# Patient Record
Sex: Male | Born: 1975
Health system: Southern US, Community
[De-identification: ages and names within clinical notes are randomized; demographics above are authoritative.]

## PROBLEM LIST (undated history)

## (undated) DIAGNOSIS — I10 Essential (primary) hypertension: Secondary | ICD-10-CM

## (undated) DIAGNOSIS — I2510 Atherosclerotic heart disease of native coronary artery without angina pectoris: Secondary | ICD-10-CM

## (undated) DIAGNOSIS — IMO0002 Reserved for concepts with insufficient information to code with codable children: Secondary | ICD-10-CM

## (undated) DIAGNOSIS — E1165 Type 2 diabetes mellitus with hyperglycemia: Secondary | ICD-10-CM

## (undated) DIAGNOSIS — E785 Hyperlipidemia, unspecified: Secondary | ICD-10-CM

---

## 2000-11-14 ENCOUNTER — Encounter: Payer: Self-pay | Admitting: Emergency Medicine

## 2000-11-14 ENCOUNTER — Emergency Department (HOSPITAL_COMMUNITY): Admission: EM | Admit: 2000-11-14 | Discharge: 2000-11-14 | Payer: Self-pay | Admitting: Emergency Medicine

## 2004-10-11 ENCOUNTER — Emergency Department (HOSPITAL_COMMUNITY): Admission: EM | Admit: 2004-10-11 | Discharge: 2004-10-11 | Payer: Self-pay | Admitting: Emergency Medicine

## 2006-07-12 ENCOUNTER — Ambulatory Visit: Payer: Self-pay | Admitting: Family Medicine

## 2006-07-13 ENCOUNTER — Ambulatory Visit: Payer: Self-pay | Admitting: *Deleted

## 2007-02-20 ENCOUNTER — Encounter (INDEPENDENT_AMBULATORY_CARE_PROVIDER_SITE_OTHER): Payer: Self-pay | Admitting: *Deleted

## 2007-03-07 ENCOUNTER — Ambulatory Visit: Payer: Self-pay | Admitting: Internal Medicine

## 2007-03-07 LAB — CONVERTED CEMR LAB
ALT: 96 units/L — ABNORMAL HIGH (ref 0–53)
AST: 34 units/L (ref 0–37)
Albumin: 4.4 g/dL (ref 3.5–5.2)
Alkaline Phosphatase: 59 units/L (ref 39–117)
BUN: 14 mg/dL (ref 6–23)
Calcium: 9.3 mg/dL (ref 8.4–10.5)
Creatinine, Ser: 1.03 mg/dL (ref 0.40–1.50)
Sodium: 142 meq/L (ref 135–145)
Total Bilirubin: 0.5 mg/dL (ref 0.3–1.2)

## 2007-03-08 ENCOUNTER — Encounter: Payer: Self-pay | Admitting: Internal Medicine

## 2007-03-08 LAB — CONVERTED CEMR LAB
HCV Ab: NEGATIVE
Hep B Core Total Ab: NEGATIVE

## 2007-03-12 ENCOUNTER — Ambulatory Visit (HOSPITAL_COMMUNITY): Admission: RE | Admit: 2007-03-12 | Discharge: 2007-03-12 | Payer: Self-pay | Admitting: Cardiovascular Disease

## 2007-03-25 ENCOUNTER — Ambulatory Visit: Payer: Self-pay | Admitting: Internal Medicine

## 2007-04-22 ENCOUNTER — Ambulatory Visit: Payer: Self-pay | Admitting: Family Medicine

## 2007-06-26 ENCOUNTER — Encounter (INDEPENDENT_AMBULATORY_CARE_PROVIDER_SITE_OTHER): Payer: Self-pay | Admitting: Family Medicine

## 2007-06-26 ENCOUNTER — Ambulatory Visit: Payer: Self-pay | Admitting: Family Medicine

## 2007-06-26 LAB — CONVERTED CEMR LAB
AST: 32 units/L (ref 0–37)
Albumin: 4.5 g/dL (ref 3.5–5.2)
Alkaline Phosphatase: 55 units/L (ref 39–117)
Basophils Relative: 1 % (ref 0–1)
Eosinophils Absolute: 0.1 10*3/uL (ref 0.0–0.7)
Eosinophils Relative: 1 % (ref 0–5)
LDL Cholesterol: 193 mg/dL — ABNORMAL HIGH (ref 0–99)
Lymphocytes Relative: 42 % (ref 12–46)
MCHC: 32.6 g/dL (ref 30.0–36.0)
Monocytes Absolute: 0.6 10*3/uL (ref 0.1–1.0)
Neutro Abs: 2.4 10*3/uL (ref 1.7–7.7)
RDW: 13.7 % (ref 11.5–15.5)
Sodium: 139 meq/L (ref 135–145)
Total Bilirubin: 0.6 mg/dL (ref 0.3–1.2)
Total Protein: 7.2 g/dL (ref 6.0–8.3)
VLDL: 50 mg/dL — ABNORMAL HIGH (ref 0–40)

## 2007-10-08 ENCOUNTER — Ambulatory Visit: Payer: Self-pay | Admitting: Family Medicine

## 2007-11-14 ENCOUNTER — Ambulatory Visit: Payer: Self-pay | Admitting: Internal Medicine

## 2007-11-14 LAB — CONVERTED CEMR LAB
AST: 24 units/L (ref 0–37)
Albumin: 4.4 g/dL (ref 3.5–5.2)
BUN: 13 mg/dL (ref 6–23)
Calcium: 9.4 mg/dL (ref 8.4–10.5)
Glucose, Bld: 113 mg/dL — ABNORMAL HIGH (ref 70–99)
Potassium: 3.9 meq/L (ref 3.5–5.3)
Total Bilirubin: 0.6 mg/dL (ref 0.3–1.2)
Total Protein: 7.2 g/dL (ref 6.0–8.3)

## 2007-12-03 ENCOUNTER — Ambulatory Visit: Payer: Self-pay | Admitting: Internal Medicine

## 2009-03-10 ENCOUNTER — Ambulatory Visit: Payer: Self-pay | Admitting: Internal Medicine

## 2009-03-15 ENCOUNTER — Ambulatory Visit: Payer: Self-pay | Admitting: Internal Medicine

## 2009-03-15 ENCOUNTER — Encounter: Payer: Self-pay | Admitting: Internal Medicine

## 2009-03-15 LAB — CONVERTED CEMR LAB
AST: 18 units/L (ref 0–37)
Albumin: 4.5 g/dL (ref 3.5–5.2)
CO2: 23 meq/L (ref 19–32)
Calcium: 9.2 mg/dL (ref 8.4–10.5)
Glucose, Bld: 101 mg/dL — ABNORMAL HIGH (ref 70–99)
HDL: 56 mg/dL (ref >39)
LDL Cholesterol: 168 mg/dL — ABNORMAL HIGH (ref 0–99)
Total Protein: 7 g/dL (ref 6.0–8.3)
VLDL: 16 mg/dL (ref 0–40)

## 2009-05-25 ENCOUNTER — Ambulatory Visit: Payer: Self-pay | Admitting: Internal Medicine

## 2009-05-25 ENCOUNTER — Encounter (INDEPENDENT_AMBULATORY_CARE_PROVIDER_SITE_OTHER): Payer: Self-pay | Admitting: Family Medicine

## 2009-05-25 LAB — CONVERTED CEMR LAB
Albumin: 4.4 g/dL (ref 3.5–5.2)
BUN: 16 mg/dL (ref 6–23)
HDL: 56 mg/dL (ref >39)
Hgb A1c MFr Bld: 6.2 % — ABNORMAL HIGH (ref 4.6–6.1)
LDL Cholesterol: 122 mg/dL — ABNORMAL HIGH (ref 0–99)
Sodium: 140 meq/L (ref 135–145)
Total Bilirubin: 0.8 mg/dL (ref 0.3–1.2)
Total CHOL/HDL Ratio: 3.5
Total Protein: 7.4 g/dL (ref 6.0–8.3)
Triglycerides: 86 mg/dL (ref <150)

## 2009-10-20 ENCOUNTER — Encounter (INDEPENDENT_AMBULATORY_CARE_PROVIDER_SITE_OTHER): Payer: Self-pay | Admitting: Adult Health

## 2009-10-20 ENCOUNTER — Ambulatory Visit: Payer: Self-pay | Admitting: Family Medicine

## 2009-10-20 ENCOUNTER — Ambulatory Visit (HOSPITAL_COMMUNITY): Admission: RE | Admit: 2009-10-20 | Discharge: 2009-10-20 | Payer: Self-pay | Admitting: Adult Health

## 2009-10-20 LAB — CONVERTED CEMR LAB
BUN: 16 mg/dL (ref 6–23)
CO2: 23 meq/L (ref 19–32)
Chloride: 108 meq/L (ref 96–112)
HDL: 59 mg/dL (ref >39)
Total Bilirubin: 0.5 mg/dL (ref 0.3–1.2)
Triglycerides: 192 mg/dL — ABNORMAL HIGH (ref <150)

## 2010-06-26 ENCOUNTER — Encounter: Payer: Self-pay | Admitting: Family Medicine

## 2016-08-31 ENCOUNTER — Inpatient Hospital Stay (HOSPITAL_COMMUNITY)
Admission: EM | Admit: 2016-08-31 | Discharge: 2016-09-02 | DRG: 247 | Disposition: A | Discharge status: Home / Self Care | Payer: Medicaid Other | Attending: Internal Medicine | Admitting: Internal Medicine

## 2016-08-31 ENCOUNTER — Encounter (HOSPITAL_COMMUNITY): Payer: Self-pay

## 2016-08-31 ENCOUNTER — Encounter (HOSPITAL_COMMUNITY)
Admission: EM | Disposition: A | Discharge status: Home / Self Care | Payer: Self-pay | Source: Home / Self Care | Attending: Internal Medicine

## 2016-08-31 DIAGNOSIS — I2510 Atherosclerotic heart disease of native coronary artery without angina pectoris: Secondary | ICD-10-CM | POA: Diagnosis present

## 2016-08-31 DIAGNOSIS — E876 Hypokalemia: Secondary | ICD-10-CM | POA: Diagnosis not present

## 2016-08-31 DIAGNOSIS — I2119 ST elevation (STEMI) myocardial infarction involving other coronary artery of inferior wall: Secondary | ICD-10-CM | POA: Diagnosis present

## 2016-08-31 DIAGNOSIS — I10 Essential (primary) hypertension: Secondary | ICD-10-CM | POA: Diagnosis present

## 2016-08-31 DIAGNOSIS — I255 Ischemic cardiomyopathy: Secondary | ICD-10-CM | POA: Diagnosis present

## 2016-08-31 DIAGNOSIS — R079 Chest pain, unspecified: Secondary | ICD-10-CM | POA: Diagnosis present

## 2016-08-31 DIAGNOSIS — E785 Hyperlipidemia, unspecified: Secondary | ICD-10-CM | POA: Diagnosis present

## 2016-08-31 DIAGNOSIS — E1165 Type 2 diabetes mellitus with hyperglycemia: Secondary | ICD-10-CM | POA: Diagnosis present

## 2016-08-31 DIAGNOSIS — I2111 ST elevation (STEMI) myocardial infarction involving right coronary artery: Secondary | ICD-10-CM

## 2016-08-31 DIAGNOSIS — I213 ST elevation (STEMI) myocardial infarction of unspecified site: Secondary | ICD-10-CM

## 2016-08-31 DIAGNOSIS — Z955 Presence of coronary angioplasty implant and graft: Secondary | ICD-10-CM

## 2016-08-31 HISTORY — PX: CORONARY STENT INTERVENTION: CATH118234

## 2016-08-31 HISTORY — PX: LEFT HEART CATH AND CORONARY ANGIOGRAPHY: CATH118249

## 2016-08-31 HISTORY — DX: Hyperlipidemia, unspecified: E78.5

## 2016-08-31 HISTORY — DX: Atherosclerotic heart disease of native coronary artery without angina pectoris: I25.10

## 2016-08-31 HISTORY — DX: Essential (primary) hypertension: I10

## 2016-08-31 HISTORY — DX: Reserved for concepts with insufficient information to code with codable children: IMO0002

## 2016-08-31 HISTORY — DX: Type 2 diabetes mellitus with hyperglycemia: E11.65

## 2016-08-31 LAB — COMPREHENSIVE METABOLIC PANEL
ALT: 74 U/L — ABNORMAL HIGH (ref 17–63)
ANION GAP: 11 (ref 5–15)
AST: 57 U/L — ABNORMAL HIGH (ref 15–41)
Albumin: 4.1 g/dL (ref 3.5–5.0)
Alkaline Phosphatase: 68 U/L (ref 38–126)
BUN: 15 mg/dL (ref 6–20)
CHLORIDE: 97 mmol/L — AB (ref 101–111)
CO2: 25 mmol/L (ref 22–32)
Calcium: 8.8 mg/dL — ABNORMAL LOW (ref 8.9–10.3)
Creatinine, Ser: 1.11 mg/dL (ref 0.61–1.24)
Glucose, Bld: 379 mg/dL — ABNORMAL HIGH (ref 65–99)
Potassium: 3.6 mmol/L (ref 3.5–5.1)
SODIUM: 133 mmol/L — AB (ref 135–145)
Total Bilirubin: 0.4 mg/dL (ref 0.3–1.2)
Total Protein: 6.7 g/dL (ref 6.5–8.1)

## 2016-08-31 LAB — CBC
HEMATOCRIT: 41.8 % (ref 39.0–52.0)
Hemoglobin: 13.8 g/dL (ref 13.0–17.0)
MCH: 26.8 pg (ref 26.0–34.0)
MCHC: 33 g/dL (ref 30.0–36.0)
MCV: 81.3 fL (ref 78.0–100.0)
Platelets: 148 10*3/uL — ABNORMAL LOW (ref 150–400)
RBC: 5.14 MIL/uL (ref 4.22–5.81)
RDW: 13.3 % (ref 11.5–15.5)
WBC: 7.6 10*3/uL (ref 4.0–10.5)

## 2016-08-31 LAB — POCT I-STAT TROPONIN I: Troponin i, poc: 0 ng/mL (ref 0.00–0.08)

## 2016-08-31 LAB — DIFFERENTIAL
BASOS PCT: 0 %
Basophils Absolute: 0 10*3/uL (ref 0.0–0.1)
Eosinophils Absolute: 0.1 10*3/uL (ref 0.0–0.7)
Eosinophils Relative: 1 %
Lymphocytes Relative: 31 %
Lymphs Abs: 2.3 10*3/uL (ref 0.7–4.0)
MONOS PCT: 6 %
Monocytes Absolute: 0.5 10*3/uL (ref 0.1–1.0)
NEUTROS ABS: 4.7 10*3/uL (ref 1.7–7.7)
Neutrophils Relative %: 62 %

## 2016-08-31 LAB — LIPID PANEL
CHOL/HDL RATIO: 3.8 ratio
CHOLESTEROL: 189 mg/dL (ref 0–200)
HDL: 50 mg/dL (ref >40)
LDL Cholesterol: 125 mg/dL — ABNORMAL HIGH (ref 0–99)
TRIGLYCERIDES: 72 mg/dL (ref <150)
VLDL: 14 mg/dL (ref 0–40)

## 2016-08-31 LAB — TROPONIN I: TROPONIN I: 2.74 ng/mL — AB (ref <0.03)

## 2016-08-31 LAB — PROTIME-INR
INR: 0.96
PROTHROMBIN TIME: 12.8 s (ref 11.4–15.2)

## 2016-08-31 LAB — APTT: APTT: 25 s (ref 24–36)

## 2016-08-31 LAB — CBG MONITORING, ED: Glucose-Capillary: 351 mg/dL — ABNORMAL HIGH (ref 65–99)

## 2016-08-31 SURGERY — LEFT HEART CATH AND CORONARY ANGIOGRAPHY
Anesthesia: LOCAL

## 2016-08-31 MED ORDER — HEPARIN (PORCINE) IN NACL 2-0.9 UNIT/ML-% IJ SOLN
INTRAMUSCULAR | Status: DC | PRN
Start: 2016-08-31 — End: 2016-08-31
  Administered 2016-08-31: 1000 mL

## 2016-08-31 MED ORDER — ASPIRIN EC 81 MG PO TBEC
81.0000 mg | DELAYED_RELEASE_TABLET | Freq: Every day | ORAL | Status: DC
  Administered 2016-09-01 – 2016-09-02 (×2): 81 mg via ORAL
  Filled 2016-08-31 (×2): qty 1

## 2016-08-31 MED ORDER — SODIUM CHLORIDE 0.9 % WEIGHT BASED INFUSION: 1.0000 mL/kg/h | INTRAVENOUS | Status: AC

## 2016-08-31 MED ORDER — HEPARIN SODIUM (PORCINE) 5000 UNIT/ML IJ SOLN: 5000.0000 [IU] | Freq: Three times a day (TID) | INTRAMUSCULAR | Status: DC

## 2016-08-31 MED ORDER — TICAGRELOR 90 MG PO TABS
ORAL_TABLET | ORAL | Status: AC
  Filled 2016-08-31: qty 1

## 2016-08-31 MED ORDER — VERAPAMIL HCL 2.5 MG/ML IV SOLN
INTRAVENOUS | Status: AC
  Filled 2016-08-31: qty 2

## 2016-08-31 MED ORDER — NITROGLYCERIN 0.4 MG SL SUBL: 0.4000 mg | SUBLINGUAL_TABLET | SUBLINGUAL | Status: DC | PRN

## 2016-08-31 MED ORDER — INSULIN ASPART 100 UNIT/ML ~~LOC~~ SOLN
4.0000 [IU] | Freq: Three times a day (TID) | SUBCUTANEOUS | Status: DC
  Administered 2016-09-01 – 2016-09-02 (×5): 4 [IU] via SUBCUTANEOUS

## 2016-08-31 MED ORDER — HEPARIN SODIUM (PORCINE) 1000 UNIT/ML IJ SOLN
INTRAMUSCULAR | Status: DC | PRN
  Administered 2016-08-31 (×2): 4000 [IU] via INTRAVENOUS

## 2016-08-31 MED ORDER — ACETAMINOPHEN 325 MG PO TABS: 650.0000 mg | ORAL_TABLET | ORAL | Status: DC | PRN

## 2016-08-31 MED ORDER — INSULIN ASPART 100 UNIT/ML ~~LOC~~ SOLN
0.0000 [IU] | Freq: Three times a day (TID) | SUBCUTANEOUS | Status: DC
  Administered 2016-09-01: 3 [IU] via SUBCUTANEOUS
  Administered 2016-09-01: 5 [IU] via SUBCUTANEOUS
  Administered 2016-09-01 – 2016-09-02 (×2): 3 [IU] via SUBCUTANEOUS
  Administered 2016-09-02: 5 [IU] via SUBCUTANEOUS

## 2016-08-31 MED ORDER — IOPAMIDOL (ISOVUE-370) INJECTION 76%
INTRAVENOUS | Status: AC
  Filled 2016-08-31: qty 50

## 2016-08-31 MED ORDER — SODIUM CHLORIDE 0.9% FLUSH
3.0000 mL | Freq: Two times a day (BID) | INTRAVENOUS | Status: DC
  Administered 2016-09-01 – 2016-09-02 (×2): 3 mL via INTRAVENOUS

## 2016-08-31 MED ORDER — NITROGLYCERIN 1 MG/10 ML FOR IR/CATH LAB
INTRA_ARTERIAL | Status: AC
  Filled 2016-08-31: qty 10

## 2016-08-31 MED ORDER — LIDOCAINE HCL (PF) 1 % IJ SOLN
INTRAMUSCULAR | Status: DC | PRN
Start: 2016-08-31 — End: 2016-08-31
  Administered 2016-08-31: 2 mL

## 2016-08-31 MED ORDER — SODIUM CHLORIDE 0.9 % IV SOLN: 250.0000 mL | INTRAVENOUS | Status: DC | PRN

## 2016-08-31 MED ORDER — LIDOCAINE HCL (PF) 1 % IJ SOLN
INTRAMUSCULAR | Status: AC
  Filled 2016-08-31: qty 30

## 2016-08-31 MED ORDER — HYDRALAZINE HCL 20 MG/ML IJ SOLN: 5.0000 mg | Freq: Four times a day (QID) | INTRAMUSCULAR | Status: DC | PRN

## 2016-08-31 MED ORDER — SODIUM CHLORIDE 0.9% FLUSH: 3.0000 mL | INTRAVENOUS | Status: DC | PRN

## 2016-08-31 MED ORDER — METOPROLOL TARTRATE 25 MG PO TABS
25.0000 mg | ORAL_TABLET | Freq: Two times a day (BID) | ORAL | Status: DC
  Administered 2016-09-01 (×3): 25 mg via ORAL
  Filled 2016-08-31 (×5): qty 1

## 2016-08-31 MED ORDER — ATORVASTATIN CALCIUM 80 MG PO TABS
80.0000 mg | ORAL_TABLET | Freq: Every day | ORAL | Status: DC
  Administered 2016-09-01 – 2016-09-02 (×2): 80 mg via ORAL
  Filled 2016-08-31 (×3): qty 1

## 2016-08-31 MED ORDER — TICAGRELOR 90 MG PO TABS
ORAL_TABLET | ORAL | Status: DC | PRN
  Administered 2016-08-31: 180 mg via ORAL

## 2016-08-31 MED ORDER — ONDANSETRON HCL 4 MG/2ML IJ SOLN: 4.0000 mg | Freq: Four times a day (QID) | INTRAMUSCULAR | Status: DC | PRN

## 2016-08-31 MED ORDER — IOPAMIDOL (ISOVUE-370) INJECTION 76%
INTRAVENOUS | Status: AC
  Filled 2016-08-31: qty 125

## 2016-08-31 MED ORDER — ASPIRIN 81 MG PO CHEW: 81.0000 mg | CHEWABLE_TABLET | Freq: Every day | ORAL | Status: DC

## 2016-08-31 MED ORDER — HEPARIN SODIUM (PORCINE) 5000 UNIT/ML IJ SOLN
5000.0000 [IU] | Freq: Three times a day (TID) | INTRAMUSCULAR | Status: DC
  Administered 2016-09-01 – 2016-09-02 (×4): 5000 [IU] via SUBCUTANEOUS
  Filled 2016-08-31 (×4): qty 1

## 2016-08-31 MED ORDER — HEPARIN (PORCINE) IN NACL 2-0.9 UNIT/ML-% IJ SOLN
INTRAMUSCULAR | Status: AC
  Filled 2016-08-31: qty 1000

## 2016-08-31 MED ORDER — HEPARIN SODIUM (PORCINE) 1000 UNIT/ML IJ SOLN
INTRAMUSCULAR | Status: AC
  Filled 2016-08-31: qty 1

## 2016-08-31 MED ORDER — SODIUM CHLORIDE 0.9 % IV SOLN
INTRAVENOUS | Status: DC | PRN
  Administered 2016-08-31: 75 mL/h via INTRAVENOUS

## 2016-08-31 MED ORDER — TICAGRELOR 90 MG PO TABS
90.0000 mg | ORAL_TABLET | Freq: Two times a day (BID) | ORAL | Status: DC
  Administered 2016-09-01 – 2016-09-02 (×3): 90 mg via ORAL
  Filled 2016-08-31 (×4): qty 1

## 2016-08-31 MED ORDER — INSULIN ASPART 100 UNIT/ML ~~LOC~~ SOLN
0.0000 [IU] | Freq: Every day | SUBCUTANEOUS | Status: DC
  Administered 2016-09-01: 4 [IU] via SUBCUTANEOUS

## 2016-08-31 MED ORDER — NITROGLYCERIN 1 MG/10 ML FOR IR/CATH LAB
INTRA_ARTERIAL | Status: DC | PRN
  Administered 2016-08-31: 200 ug

## 2016-08-31 SURGICAL SUPPLY — 18 items
BALLN EUPHORA RX 2.5X12 (BALLOONS) ×2
BALLN ~~LOC~~ EMERGE MR 3.5X20 (BALLOONS) ×2
BALLOON EUPHORA RX 2.5X12 (BALLOONS) ×1 IMPLANT
BALLOON ~~LOC~~ EMERGE MR 3.5X20 (BALLOONS) ×1 IMPLANT
CATH 5FR JL3.5 JR4 ANG PIG MP (CATHETERS) ×2 IMPLANT
CATH VISTA GUIDE 6FR JR4 (CATHETERS) ×2 IMPLANT
DEVICE RAD COMP TR BAND LRG (VASCULAR PRODUCTS) ×2 IMPLANT
GLIDESHEATH SLEND SS 6F .021 (SHEATH) ×2 IMPLANT
GUIDEWIRE INQWIRE 1.5J.035X260 (WIRE) ×1 IMPLANT
INQWIRE 1.5J .035X260CM (WIRE) ×2
KIT ENCORE 26 ADVANTAGE (KITS) ×2 IMPLANT
KIT HEART LEFT (KITS) ×2 IMPLANT
PACK CARDIAC CATHETERIZATION (CUSTOM PROCEDURE TRAY) ×2 IMPLANT
STENT PROMUS PREM MR 3.5X38 (Permanent Stent) ×2 IMPLANT
SYR MEDRAD MARK V 150ML (SYRINGE) ×2 IMPLANT
TRANSDUCER W/STOPCOCK (MISCELLANEOUS) ×2 IMPLANT
TUBING CIL FLEX 10 FLL-RA (TUBING) ×2 IMPLANT
WIRE ASAHI PROWATER 180CM (WIRE) ×2 IMPLANT

## 2016-08-31 NOTE — Progress Notes (Signed)
CRITICAL VALUE ALERT  Critical value received:  Troponin 2.74  Date of notification:  08/31/16  Time of notification:  2245  Critical value read back: yes   Nurse who received alert:  Devota Pacekaylee Kerilyn Cortner   MD notified: Cardiology Fellow notified

## 2016-08-31 NOTE — ED Provider Notes (Signed)
[facility]-EMERGENCY DEPT Provider Note   CSN: [id number] Arrival date & time: 08/31/16  2001     History   Chief Complaint Chief Complaint  Patient presents with  . Code STEMI    HPI Scott Mcclain is a 41 y.o. male     The history is provided by the patient and the EMS personnel. No language interpreter was used.  Chest Pain   This is a new problem. The current episode started 1 to 2 hours ago. The problem occurs constantly. The problem has been gradually worsening. The pain is associated with exertion. The pain is present in the substernal region. The pain is at a severity of 6/10. The pain is moderate. The quality of the pain is described as exertional, heavy and pressure-like. The pain does not radiate. Duration of episode(s) is 2 hours. Associated symptoms include diaphoresis and shortness of breath. Pertinent negatives include no abdominal pain, no back pain, no cough, no dizziness, no fever, no headaches, no hemoptysis, no irregular heartbeat, no nausea, no near-syncope (lightheadeness), no palpitations, no syncope, no vomiting and no weakness. He has tried nothing for the symptoms. The treatment provided no relief.    No past medical history on file.  There are no active problems to display for this patient.   No past surgical history on file.     Home Medications    Prior to Admission medications   Not on File    Family History No family history on file.  Social History Social History  Substance Use Topics  . Smoking status: Not on file  . Smokeless tobacco: Not on file  . Alcohol use Not on file     Allergies   Patient has no allergy information on record.   Review of Systems Review of Systems  Constitutional: Positive for diaphoresis. Negative for activity change, chills, fatigue and fever.  HENT: Negative for congestion and rhinorrhea.   Eyes: Negative for visual disturbance.  Respiratory: Positive for chest tightness and shortness of breath.  Negative for cough, hemoptysis, wheezing and stridor.   Cardiovascular: Positive for chest pain. Negative for palpitations, leg swelling, syncope and near-syncope (lightheadeness).  Gastrointestinal: Negative for abdominal distention, abdominal pain, blood in stool, constipation, diarrhea, nausea and vomiting.  Genitourinary: Negative for difficulty urinating, dysuria and flank pain.  Musculoskeletal: Negative for back pain and gait problem.  Skin: Negative for rash and wound.  Neurological: Negative for dizziness, weakness, light-headedness and headaches.  Psychiatric/Behavioral: Negative for agitation.  All other systems reviewed and are negative.    Physical Exam Updated Vital Signs BP 118/84 (BP Location: Right Arm)   Pulse (!) 56   Temp 97.4 F (36.3 C) (Oral)   Resp 16   Ht 5\' 5"  (1.651 m)   Wt 170 lb (77.1 kg)   SpO2 100%   BMI 28.29 kg/m   Physical Exam  Constitutional: He is oriented to person, place, and time. He appears well-developed and well-nourished. No distress.  HENT:  Head: Normocephalic and atraumatic.  Right Ear: External ear normal.  Left Ear: External ear normal.  Nose: Nose normal.  Mouth/Throat: Oropharynx is clear and moist. No oropharyngeal exudate.  Eyes: Conjunctivae and EOM are normal. Pupils are equal, round, and reactive to light.  Neck: Normal range of motion. Neck supple.  Cardiovascular: Normal rate, normal heart sounds and intact distal pulses.   No murmur heard. Pulmonary/Chest: Effort normal and breath sounds normal. No stridor. No respiratory distress. He has no wheezes. He has no rales. He  exhibits no tenderness.  Abdominal: Soft. There is no tenderness. There is no rebound and no guarding.  Musculoskeletal: He exhibits no edema or tenderness.  Neurological: He is alert and oriented to person, place, and time. He displays normal reflexes. No cranial nerve deficit. He exhibits normal muscle tone. Coordination normal.  Skin: Skin is warm.  Capillary refill takes less than 2 seconds. No rash noted. He is diaphoretic. No erythema. No pallor.  Nursing note and vitals reviewed.    ED Treatments / Results  Labs (all labs ordered are listed, but only abnormal results are displayed) Labs Reviewed  CBC - Abnormal; Notable for the following:       Result Value   Platelets 148 (*)    All other components within normal limits  COMPREHENSIVE METABOLIC PANEL - Abnormal; Notable for the following:    Sodium 133 (*)    Chloride 97 (*)    Glucose, Bld 379 (*)    Calcium 8.8 (*)    AST 57 (*)    ALT 74 (*)    All other components within normal limits  LIPID PANEL - Abnormal; Notable for the following:    LDL Cholesterol 125 (*)    All other components within normal limits  TROPONIN I - Abnormal; Notable for the following:    Troponin I 2.74 (*)    All other components within normal limits  GLUCOSE, CAPILLARY - Abnormal; Notable for the following:    Glucose-Capillary 305 (*)    All other components within normal limits  CBG MONITORING, ED - Abnormal; Notable for the following:    Glucose-Capillary 351 (*)    All other components within normal limits  DIFFERENTIAL  PROTIME-INR  APTT  TROPONIN I  BASIC METABOLIC PANEL  CBC  HEMOGLOBIN A1C  HIV ANTIBODY (ROUTINE TESTING)  TROPONIN I  TROPONIN I  LIPID PANEL  BASIC METABOLIC PANEL  CBC  I-STAT TROPOININ, ED  POCT I-STAT TROPONIN I    EKG  EKG Interpretation  Date/Time:  Thursday August 31 2016 20:02:58 EDT Ventricular Rate:  55 PR Interval:    QRS Duration: 104 QT Interval:  410 QTC Calculation: 393 R Axis:   66 Text Interpretation:  Sinus rhythm Low voltage, precordial leads Abnrm T, consider ischemia, anterolateral lds ST elevation, consider inferior injury ** ** ACUTE MI / STEMI ** ** Confirmed by Maxten Shuler MD, Maxie Slovacek ([id number]) on 09/01/2016 2:45:37 AM       Radiology No results found.  Procedures Procedures (including critical care time)  CRITICAL  CARE Performed by: Canary Brim Delsie Amador Total critical care time: 35 minutes STEMI taken to Cath lab with 100% occlusion of RCA needing stenting.  Critical care time was exclusive of separately billable procedures and treating other patients. Critical care was necessary to treat or prevent imminent or life-threatening deterioration. Critical care was time spent personally by me on the following activities: development of treatment plan with patient and/or surrogate as well as nursing, discussions with consultants, evaluation of patient's response to treatment, examination of patient, obtaining history from patient or surrogate, ordering and performing treatments and interventions, ordering and review of laboratory studies, ordering and review of radiographic studies, pulse oximetry and re-evaluation of patient's condition.   Medications Ordered in ED Medications  aspirin EC tablet 81 mg (not administered)  nitroGLYCERIN (NITROSTAT) SL tablet 0.4 mg (not administered)  acetaminophen (TYLENOL) tablet 650 mg (not administered)  ondansetron (ZOFRAN) injection 4 mg (not administered)  metoprolol tartrate (LOPRESSOR) tablet 25 mg (25 mg Oral  Given 09/01/16 0030)  atorvastatin (LIPITOR) tablet 80 mg (not administered)  hydrALAZINE (APRESOLINE) injection 5 mg (not administered)  insulin aspart (novoLOG) injection 0-15 Units (not administered)  insulin aspart (novoLOG) injection 4 Units (not administered)  insulin aspart (novoLOG) injection 0-5 Units (4 Units Subcutaneous Given 09/01/16 0031)  heparin injection 5,000 Units (not administered)  0.9% sodium chloride infusion (1 mL/kg/hr  77.1 kg Intravenous Transfusing/Transfer 08/31/16 2300)  sodium chloride flush (NS) 0.9 % injection 3 mL (not administered)  sodium chloride flush (NS) 0.9 % injection 3 mL (not administered)  0.9 %  sodium chloride infusion (not administered)  ticagrelor (BRILINTA) tablet 90 mg (not administered)     Initial  Impression / Assessment and Plan / ED Course  I have reviewed the triage vital signs and the nursing notes.  Pertinent labs & imaging results that were available during my care of the patient were reviewed by me and considered in my medical decision making (see chart for details).     Scott Mcclain is a 41 y.o. male with a past medical history significant for hypertension, hyperlipidemia, and diabetes who presents for chest pain. In route, EKG was transmitted and code STEMI was called for possible inferior STEMI. On arrival, patient has received aspirin. Patient is continued to have severe chest pain in his central chest. Patient had associated diaphoresis, lightheadedness, and some nausea. Patient has never had this pain before. Patient denies trauma. Patient had some shortness of breath with it.  Patient denied any recent ears, chills, cough, congestion. He denies any sick contacts or any other problems today. He said he was at work when he started having severe diaphoresis and then the chest pain began. He denies history of DVT, PE, or any heart disease.  Repeat EKG continued to show ST elevations with some inversions. STEMI was continued and cardiology came to the bedside. They agreed with STEMI and will take the patient to the Cath Lab.  Patient taken to cath lab for further management.    Final Clinical Impressions(s) / ED Diagnoses   Final diagnoses:  ST elevation myocardial infarction (STEMI), unspecified artery (HCC)     Clinical Impression: 1. ST elevation myocardial infarction (STEMI), unspecified artery [facility](HCC)     Disposition: Admit to Cardiology after Cath lab    Heide Scaleshristopher J Ilana Prezioso, MD 09/01/16 [id number]

## 2016-08-31 NOTE — ED Notes (Signed)
Cardiology in room with Dr Rush Landmarkegeler and this RN

## 2016-08-31 NOTE — H&P (Signed)
CARDIOLOGY HISTORY AND PHYSICAL     Date: 08/31/2016 Admitting Physician: Scott M [place], MD  Chief Complaint:  Chest pain ____________________________________________________________________ History of Present Illness:  Scott Mcclain is a 40 y.o. old male with medical history noted below presents via EMS with ECG concerning for inferior STEMI.  Patient states he was at work earlier today when he had sudden onset chest pain the he described as a squeezing sensation.  The patient was rated a 6/10.  He also felt short of breath and diaphoretic.  No nausea/vomiting or syncope.  He has a history of DM and hypertension.  No other cardiac history.  Review of Systems:   Review of Systems:  GEN: no fever, chills, nausea, vomiting, weight change  HEENT: no vision or hearing changes  PULM: no coughing, +SOB  CV: +chest pain, palpitations, PND, orthopnea  GI: no abdominal pain  GU: no dysuria  EXT: no swelling  SKIN: no rashes  NEURO: no numbness or tingling  HEME: no bleeding or bruising  GYN: none  --12 point review systems- otherwise negative.  All other systems reviewed and are negative  Past Medical History:  Diagnosis Date  . Diabetes mellitus without complication (HCC)   . Hyperlipidemia   . Hypertension     History reviewed. No pertinent surgical history.  Social History   Social History  . Marital status: N/A    Spouse name: N/A  . Number of children: N/A  . Years of education: N/A   Occupational History  . Not on file.   Social History Main Topics  . Smoking status: Never Smoker  . Smokeless tobacco: Never Used  . Alcohol use Yes     Comment: occ  . Drug use: No  . Sexual activity: Not on file   Other Topics Concern  . Not on file   Social History Narrative  . No narrative on file    Family History  Problem Relation Age of Onset  . Diabetes Mother     Past Cardiovascular History:  - No documented h/o CAD - No documented h/o MI - No documented  h/o CHF - No documented h/o PVD - No documented h/o AAA - No documented h/o valvular heart disease - No documented h/o CVA - No documented h/o Arrhythmias - No documented h/o A-fib  - No documented h/o congenital heart disease - No documented h/o CABG - No documented h/o PCI - No documented h/o cardiac devices (Pacer/ICD/CRT) - No documented h/o cardiac surgery       Most recent stress test:  None  Most recent echocardiography:  None  Most recent left heart catheterization:  None  CABG:  Date/ Physician: None  Device history:  None  Prior to Admission medications   Not on File    No Known Allergies  Social History:   Social History  Substance Use Topics  . Smoking status: Never Smoker  . Smokeless tobacco: Never Used  . Alcohol use Yes     Comment: occ    Family History  Problem Relation Age of Onset  . Diabetes Mother     Physical Examination: Blood pressure 118/84, pulse (!) 56, temperature 97.4 F (36.3 C), temperature source Oral, resp. rate 16, height 5\' 5"  (1.651 m), weight 77.1 kg (170 lb), SpO2 100 %. General:  AAOX 4.  NAD.  NRD. Diaphoretic, mild distress secondary to chest pain HENT: Normocephalic. Atraumatic.  No acute abnom. EYES: PERRL EOMI  Neck: Supple.  No JVD.  No bruits.  Cardiovascular:  Nl S1. Nl S2. No S3. No S4. Nl PMI. No m/r/c. RRR  Pulmonary/Chest: CTA B. No rales. No wheezing.  Abdomen: Soft, NT, no masses, no organomegaly. Neuro: CN intact, no motor/sensory deficit.  Ext: Warm. No edema.  SKIN- intact  No intake or output data in the 24 hours ending 08/31/16 2025  Troponin (Point of Care Test) No results for input(s): TROPIPOC in the last 72 hours. ____________________________________________________________________ Assessment/Plan  Acute inferior STEMI   Assessment:  Patient presents with ECG findings consistent with inferior STEMI.  Proceed to the cath lab for emergent revascularization.   Plan  -  Proceed to cath lab  -   Further recommendations per interventional cardiology after the cath  DM   Assessment:  Patient with history of DM.  States he's on oral medication.  No history of insulin use.  POC glucose is 350.   Plan  -  Insulin coverage while in the hospital  -  Hypoglycemic precautions  HTN   Assessment:  BP is controlled currently.  Medications still need to be reconciled.  Will continue his home medications and supplement as needed.   Plan  -  Continue home medications once reconciled  -  PRN hydralazine for BP >140/90  Scott SnufferGregory Carys Malina, MD, PhD Cardiology   Thank you for consulting cardiology.    Electronically signed by Nada MaclachlanGregory S Doreene Mcclain 08/31/2016 Scott SnufferGregory Shandricka Monroy, MD, PhD Cardiology

## 2016-08-31 NOTE — ED Triage Notes (Signed)
Per EMS, pt from work, pt was working 2 hours pta and became diaphoretic and began having non radiating left sided chest pressure. Denies dizziness or nausea. VSS. Code stemi activated in the field. Upon arrival pt complains of left sided chest pressure 6/10. Pt has 2 IV's and received 324 asa with EMS. Pt on zoll.

## 2016-09-01 ENCOUNTER — Encounter (HOSPITAL_COMMUNITY): Payer: Self-pay | Admitting: Cardiology

## 2016-09-01 ENCOUNTER — Inpatient Hospital Stay (HOSPITAL_COMMUNITY): Payer: Medicaid Other

## 2016-09-01 DIAGNOSIS — E7800 Pure hypercholesterolemia, unspecified: Secondary | ICD-10-CM

## 2016-09-01 DIAGNOSIS — I10 Essential (primary) hypertension: Secondary | ICD-10-CM

## 2016-09-01 DIAGNOSIS — I213 ST elevation (STEMI) myocardial infarction of unspecified site: Secondary | ICD-10-CM

## 2016-09-01 DIAGNOSIS — E119 Type 2 diabetes mellitus without complications: Secondary | ICD-10-CM

## 2016-09-01 LAB — BASIC METABOLIC PANEL
Anion gap: 9 (ref 5–15)
BUN: 11 mg/dL (ref 6–20)
CALCIUM: 9 mg/dL (ref 8.9–10.3)
CHLORIDE: 104 mmol/L (ref 101–111)
CO2: 24 mmol/L (ref 22–32)
CREATININE: 0.66 mg/dL (ref 0.61–1.24)
GFR calc non Af Amer: >60 mL/min (ref >60)
GLUCOSE: 171 mg/dL — AB (ref 65–99)
Potassium: 3.3 mmol/L — ABNORMAL LOW (ref 3.5–5.1)
Sodium: 137 mmol/L (ref 135–145)

## 2016-09-01 LAB — GLUCOSE, CAPILLARY
GLUCOSE-CAPILLARY: 161 mg/dL — AB (ref 65–99)
GLUCOSE-CAPILLARY: 180 mg/dL — AB (ref 65–99)
Glucose-Capillary: 192 mg/dL — ABNORMAL HIGH (ref 65–99)
Glucose-Capillary: 202 mg/dL — ABNORMAL HIGH (ref 65–99)
Glucose-Capillary: 305 mg/dL — ABNORMAL HIGH (ref 65–99)

## 2016-09-01 LAB — ECHOCARDIOGRAM COMPLETE
Height: 65 in
WEIGHTICAEL: 2720 [oz_av]

## 2016-09-01 LAB — CBC
HCT: 41 % (ref 39.0–52.0)
Hemoglobin: 13.7 g/dL (ref 13.0–17.0)
MCH: 26.9 pg (ref 26.0–34.0)
MCHC: 33.4 g/dL (ref 30.0–36.0)
MCV: 80.6 fL (ref 78.0–100.0)
PLATELETS: 163 10*3/uL (ref 150–400)
RBC: 5.09 MIL/uL (ref 4.22–5.81)
RDW: 13.2 % (ref 11.5–15.5)
WBC: 9.5 10*3/uL (ref 4.0–10.5)

## 2016-09-01 LAB — TROPONIN I
Troponin I: 13.85 ng/mL (ref <0.03)
Troponin I: 12.82 ng/mL (ref <0.03)

## 2016-09-01 LAB — LIPID PANEL
CHOL/HDL RATIO: 3.7 ratio
Cholesterol: 187 mg/dL (ref 0–200)
HDL: 51 mg/dL (ref >40)
LDL CALC: 118 mg/dL — AB (ref 0–99)
Triglycerides: 89 mg/dL (ref <150)
VLDL: 18 mg/dL (ref 0–40)

## 2016-09-01 LAB — HIV ANTIBODY (ROUTINE TESTING W REFLEX): HIV SCREEN 4TH GENERATION: NONREACTIVE

## 2016-09-01 LAB — POCT ACTIVATED CLOTTING TIME: ACTIVATED CLOTTING TIME: 312 s

## 2016-09-01 LAB — MRSA PCR SCREENING: MRSA by PCR: NEGATIVE

## 2016-09-01 MED ORDER — POTASSIUM CHLORIDE CRYS ER 20 MEQ PO TBCR
20.0000 meq | EXTENDED_RELEASE_TABLET | Freq: Once | ORAL | Status: AC
  Administered 2016-09-01: 20 meq via ORAL
  Filled 2016-09-01: qty 1

## 2016-09-01 MED FILL — Verapamil HCl IV Soln 2.5 MG/ML: INTRAVENOUS | Qty: 2 | Status: AC

## 2016-09-01 MED FILL — Heparin Sodium (Porcine) 2 Unit/ML in Sodium Chloride 0.9%: INTRAMUSCULAR | Qty: 500 | Status: CN

## 2016-09-01 NOTE — Progress Notes (Signed)
CARDIAC REHAB PHASE I   PRE:  Rate/Rhythm: 64 SR    BP: sitting 109/68    SaO2: 99 RA  MODE:  Ambulation: 350 ft   POST:  Rate/Rhythm: 80 SR    BP: sitting 119/82     SaO2: 100 RA  Tolerated well. Sts he is sore in general. Ed completed with pt and wife. Voiced understanding with good questions asked. He was told he has preDM 2 months ago and started on meds but did not receive any education. He also sts he does not have PCP. Pt would benefit from CM and help getting PCP and more DM education. I went over diet with them. He understands his Brilinta but would benefit from reiteration. Will refer to [place] [facility]. [id number]   Harriet Masson CES, ACSM 09/01/2016 12:49 PM

## 2016-09-01 NOTE — Care Management Note (Signed)
Case Management Note  Patient Details  Name: Scott Mcclain MRN: [id number] Date of Birth: [birth year]  Subjective/Objective:             Patient provided with 30 day card and booklet for Brilinta. Patient assistance application that needs MD signature on fronmt of shadow chart with request on sticky note for MD to fill out. Patient provided with instructions to call [facility] on Monday to schedule follow up and set up PCP.        Action/Plan:   Expected Discharge Date:                  Expected Discharge Plan:     In-House Referral:     Discharge planning Services  CM Consult, Medication Assistance  Post Acute Care Choice:    Choice offered to:     DME Arranged:    DME Agency:     HH Arranged:    HH Agency:     Status of Service:  In process, will continue to follow  If discussed at Long Length of Stay Meetings, dates discussed:    Additional Comments:  Lawerance Sabal, RN 09/01/2016, 4:08 PM

## 2016-09-01 NOTE — Progress Notes (Signed)
  Echocardiogram 2D Echocardiogram has been performed.  Scott Mcclain 09/01/2016, 10:02 AM

## 2016-09-01 NOTE — Progress Notes (Signed)
Inpatient Diabetes Program Recommendations  AACE/ADA: New Consensus Statement on Inpatient Glycemic Control (2015)  Target Ranges:  Prepandial:   less than 140 mg/dL      Peak postprandial:   less than 180 mg/dL (1-2 hours)      Critically ill patients:  140 - 180 mg/dL   Lab Results  Component Value Date   GLUCAP 192 (H) 09/01/2016    Review of Glycemic Control Results for [name] (MRN [id number]) as of 09/01/2016 10:03  Ref. Range 08/31/2016 20:08 09/01/2016 03:34  Glucose Latest Ref Range: 65 - 99 mg/dL 191 (H) 478 (H)   Diabetes history: DM2 Outpatient Diabetes medications: Metformin 500 mg bid Current orders for Inpatient glycemic control: Novolog 4 units tid + Novolog correction 0-15 units tid + 0-5 units hs  Inpatient Diabetes Program Recommendations:  Noted pending A1c. Please consider while Metformin held, Lantus 15 units ( 0.2 units/kg x 77 kg). Will follow.  Thank you, Scott Mcclain. Scott Kon, RN, MSN, CDE Inpatient Glycemic Control Team Team Pager [phone] (8am-5pm) 09/01/2016 10:10 AM

## 2016-09-01 NOTE — Progress Notes (Signed)
Progress Note  Patient Name: Scott Mcclain Date of Encounter: 09/01/2016  Primary Cardiologist: [place]  Subjective   Slight twinge of chest pain this am. Overall feels much better. No dyspnea.   Inpatient Medications    Scheduled Meds: . aspirin EC  81 mg Oral Daily  . atorvastatin  80 mg Oral q1800  . heparin  5,000 Units Subcutaneous Q8H  . insulin aspart  0-15 Units Subcutaneous TID WC  . insulin aspart  0-5 Units Subcutaneous QHS  . insulin aspart  4 Units Subcutaneous TID WC  . metoprolol tartrate  25 mg Oral BID  . sodium chloride flush  3 mL Intravenous Q12H  . ticagrelor  90 mg Oral BID   Continuous Infusions: . sodium chloride     PRN Meds: sodium chloride, acetaminophen, hydrALAZINE, nitroGLYCERIN, ondansetron (ZOFRAN) IV, sodium chloride flush   Vital Signs    Vitals:   09/01/16 0400 09/01/16 0500 09/01/16 0600 09/01/16 0748  BP: 112/78 112/61 110/70   Pulse: (!) 55 (!) 58 (!) 58   Resp: Temp: 98.8 F (37.1 C)   98.4 F (36.9 C)  TempSrc: Oral   Oral  SpO2: 98% 98% 97%   Weight:      Height:        Intake/Output Summary (Last 24 hours) at 09/01/16 0829 Last data filed at 09/01/16 0600  Gross per 24 hour  Intake              537 ml  Output              800 ml  Net             -263 ml   Filed Weights   08/31/16 2006  Weight: 170 lb (77.1 kg)    Telemetry    NSR. One five beat run of idioventricular beats. - Personally Reviewed  ECG    NSR with T wave inversion in the inferior and anterolateral leads. Small Q wave inferiorly. - Personally Reviewed  Physical Exam   GEN: No acute distress.   Neck: No JVD Cardiac: RRR, no murmurs, rubs, or gallops.  Respiratory: Clear to auscultation bilaterally. GI: Soft, nontender, non-distended  MS: No edema; No deformity. Right radial site without hematoma. Neuro:  Nonfocal  Psych: Normal affect   Labs    Chemistry Recent Labs Lab 08/31/16 2008 09/01/16 0334  NA 133* 137    K 3.6 3.3*  CL 97* 104  CO2 25 24  GLUCOSE 379* 171*  BUN 15 11  CREATININE 1.11 0.66  CALCIUM 8.8* 9.0  PROT 6.7  --   ALBUMIN 4.1  --   AST 57*  --   ALT 74*  --   ALKPHOS 68  --   BILITOT 0.4  --   GFRNONAA >60 >60  GFRAA >60 >60  ANIONGAP 11 9     Hematology Recent Labs Lab 08/31/16 2008 09/01/16 0334  WBC 7.6 9.5  RBC 5.14 5.09  HGB 13.8 13.7  HCT 41.8 41.0  MCV 81.3 80.6  MCH 26.8 26.9  MCHC 33.0 33.4  RDW 13.3 13.2  PLT 148* 163    Cardiac Enzymes Recent Labs Lab 08/31/16 2008 08/31/16 2233 09/01/16 0334  TROPONINI <0.03 2.74* 13.85*    Recent Labs Lab 08/31/16 2014  TROPIPOC 0.00     BNPNo results for input(s): BNP, PROBNP in the last 168 hours.   DDimer No results for input(s): DDIMER in the last 168 hours.  Radiology    No results found.  Cardiac Studies   Procedures   Coronary Stent Intervention  Left Heart Cath and Coronary Angiography  Conclusion     Lat 1st Mrg lesion, 75 %stenosed.  Mid LAD lesion, 40 %stenosed.  There is mild left ventricular systolic dysfunction.  LV end diastolic pressure is normal.  The left ventricular ejection fraction is 45-50% by visual estimate.  A STENT PROMUS PREM MR [id number] drug eluting stent was successfully placed.  Prox RCA lesion, 100 %stenosed.  Post intervention, there is a 0% residual stenosis.   1. 2 vessel obstructive CAD    - 100% proximal RCA    - 75% branch of OM1 2. Mild LV dysfunction 3. Normal LVEDP 4. Successful stenting of the proximal to mid RCA with DES.  Plan: DAPT for one year. Statin therapy. Beta blocker. He may be a candidate for fast track discharge if no complications. If he has recurrent angina consider stenting of OM1 branch.      Patient Profile     41 y.o. male with history of DM type 2, HLD, HTN. Presented with acute inferior STEMI   Assessment & Plan    1. Inferior STEMI. Occluded proximal RCA. Peak troponin 13.85. s/p emergent stenting  with DES. Residual 75% stenosis in OM branch. Mild LV dysfunction. Echo pending. Continue DAPT with ASA and Brilinta for one year. Statin and beta blocker therapy. Will transfer to telemetry today. Ambulate. Cardiac Rehab. May be a candidate for fast track DC tomorrow if stable.   2. HTN controlled. On metoprolol  3. HLD - LDL 125. Started on high dose statin  4. DM type 2. A1c pending. On SSI. Will need to resume oral therapy on DC.   5. Hypokalemia - will replete.   Signed, Berkeley Vanaken [place], MD  09/01/2016, 8:29 AM

## 2016-09-01 NOTE — Progress Notes (Signed)
09/01/2016 1315 Received pt to room 2W23 from 2H.  Pt is A&O, no c/o voiced.  Tele monitor applied and CCMD notified.  Oriented to room, call light and bed.  Call bell in reach, family at bedside. Kathryne Hitch

## 2016-09-02 ENCOUNTER — Encounter (HOSPITAL_COMMUNITY): Payer: Self-pay | Admitting: Physician Assistant

## 2016-09-02 ENCOUNTER — Telehealth: Payer: Self-pay | Admitting: Physician Assistant

## 2016-09-02 DIAGNOSIS — I2510 Atherosclerotic heart disease of native coronary artery without angina pectoris: Secondary | ICD-10-CM

## 2016-09-02 DIAGNOSIS — I10 Essential (primary) hypertension: Secondary | ICD-10-CM

## 2016-09-02 DIAGNOSIS — I255 Ischemic cardiomyopathy: Secondary | ICD-10-CM

## 2016-09-02 LAB — HEPATIC FUNCTION PANEL
ALT: 86 U/L — AB (ref 17–63)
AST: 48 U/L — AB (ref 15–41)
Albumin: 3.8 g/dL (ref 3.5–5.0)
Alkaline Phosphatase: 61 U/L (ref 38–126)
BILIRUBIN TOTAL: 0.7 mg/dL (ref 0.3–1.2)
Total Protein: 6.6 g/dL (ref 6.5–8.1)

## 2016-09-02 LAB — BASIC METABOLIC PANEL
Anion gap: 11 (ref 5–15)
BUN: 8 mg/dL (ref 6–20)
CHLORIDE: 104 mmol/L (ref 101–111)
CO2: 24 mmol/L (ref 22–32)
CREATININE: 0.73 mg/dL (ref 0.61–1.24)
Calcium: 9.1 mg/dL (ref 8.9–10.3)
GFR calc non Af Amer: >60 mL/min (ref >60)
Glucose, Bld: 221 mg/dL — ABNORMAL HIGH (ref 65–99)
Potassium: 3.5 mmol/L (ref 3.5–5.1)
SODIUM: 139 mmol/L (ref 135–145)

## 2016-09-02 LAB — CBC
HCT: 42.9 % (ref 39.0–52.0)
Hemoglobin: 14.1 g/dL (ref 13.0–17.0)
MCH: 27 pg (ref 26.0–34.0)
MCHC: 32.9 g/dL (ref 30.0–36.0)
MCV: 82.2 fL (ref 78.0–100.0)
PLATELETS: 157 10*3/uL (ref 150–400)
RBC: 5.22 MIL/uL (ref 4.22–5.81)
RDW: 13.5 % (ref 11.5–15.5)
WBC: 7.8 10*3/uL (ref 4.0–10.5)

## 2016-09-02 LAB — HEMOGLOBIN A1C
Hgb A1c MFr Bld: 11 % — ABNORMAL HIGH (ref 4.8–5.6)
Mean Plasma Glucose: 269 mg/dL

## 2016-09-02 LAB — GLUCOSE, CAPILLARY
GLUCOSE-CAPILLARY: 223 mg/dL — AB (ref 65–99)
Glucose-Capillary: 177 mg/dL — ABNORMAL HIGH (ref 65–99)

## 2016-09-02 MED ORDER — ASPIRIN 81 MG PO TBEC: 81.0000 mg | DELAYED_RELEASE_TABLET | Freq: Every day | ORAL | 3 refills | Status: DC

## 2016-09-02 MED ORDER — TICAGRELOR 90 MG PO TABS: 90.0000 mg | ORAL_TABLET | Freq: Two times a day (BID) | ORAL | 3 refills | Status: DC

## 2016-09-02 MED ORDER — TICAGRELOR 90 MG PO TABS: 90.0000 mg | ORAL_TABLET | Freq: Two times a day (BID) | ORAL | 0 refills | Status: DC

## 2016-09-02 MED ORDER — ATORVASTATIN CALCIUM 80 MG PO TABS: 80.0000 mg | ORAL_TABLET | Freq: Every evening | ORAL | 6 refills | Status: DC

## 2016-09-02 MED ORDER — NITROGLYCERIN 0.4 MG SL SUBL: 0.4000 mg | SUBLINGUAL_TABLET | SUBLINGUAL | 3 refills | Status: DC | PRN

## 2016-09-02 MED ORDER — METOPROLOL TARTRATE 25 MG PO TABS: 25.0000 mg | ORAL_TABLET | Freq: Two times a day (BID) | ORAL | 6 refills | Status: DC

## 2016-09-02 NOTE — Progress Notes (Signed)
09/02/2016 5:34 PM Discharge AVS meds taken today and those due this evening reviewed.  Follow-up appointments and when to call md reviewed.  D/C IV and TELE.  Questions and concerns addressed.   D/C home per orders. Kathryne Hitch

## 2016-09-02 NOTE — Telephone Encounter (Signed)
Triage, I sent in a Brilinta rx to this patient's initial pharmacy listed [facility] over the weekend then learned they are not open again until Monday. The patient requested instead I prescribe them to [facility].  Tried calling there but got a voice message. I left a voicemail. Can you call [facility] on Monday to make sure they got my message to cancel Brilinta rx? Thanks. Dayna Dunn PA-C

## 2016-09-02 NOTE — Progress Notes (Signed)
Progress Note  Patient Name: Scott Mcclain Date of Encounter: 09/02/2016  Primary Cardiologist: Martinique   Subjective   Feels great. No CP, no SOB. Family at bedside. Ambulated well.  Inpatient Medications    Scheduled Meds: . aspirin EC  81 mg Oral Daily  . atorvastatin  80 mg Oral q1800  . heparin  5,000 Units Subcutaneous Q8H  . insulin aspart  0-15 Units Subcutaneous TID WC  . insulin aspart  0-5 Units Subcutaneous QHS  . insulin aspart  4 Units Subcutaneous TID WC  . metoprolol tartrate  25 mg Oral BID  . sodium chloride flush  3 mL Intravenous Q12H  . ticagrelor  90 mg Oral BID   Continuous Infusions:  PRN Meds: sodium chloride, acetaminophen, hydrALAZINE, nitroGLYCERIN, ondansetron (ZOFRAN) IV, sodium chloride flush   Vital Signs    Vitals:   09/01/16 1321 09/01/16 2005 09/02/16 0459 09/02/16 1001  BP: 106/64 105/65 100/67 (!) 90/53  Pulse:  80 83 76  Resp: '18 16 16   '$ Temp: 98.2 F (36.8 C) 98.2 F (36.8 C) 98.1 F (36.7 C)   TempSrc: Oral Oral Oral   SpO2: 100% 99% 99%   Weight:   159 lb 9.6 oz (72.4 kg)   Height:        Intake/Output Summary (Last 24 hours) at 09/02/16 1253 Last data filed at 09/02/16 1002  Gross per 24 hour  Intake              603 ml  Output             1000 ml  Net             -397 ml   Filed Weights   08/31/16 2006 09/02/16 0459  Weight: 170 lb (77.1 kg) 159 lb 9.6 oz (72.4 kg)    Telemetry    No adverse rhythms - Personally Reviewed  ECG    09/01/16 - NSR with mild TWI diffuse - Personally Reviewed  Physical Exam   GEN: No acute distress.   Neck: No JVD Cardiac: RRR, no murmurs, rubs, or gallops.  Respiratory: Clear to auscultation bilaterally. GI: Soft, nontender, non-distended  MS: No edema; No deformity. Radial site normal, good pulse Neuro:  Nonfocal  Psych: Normal affect   Labs    Chemistry Recent Labs Lab 08/31/16 2008 09/01/16 0334 09/02/16 0238  NA 133* 137 139  K 3.6 3.3* 3.5  CL 97* 104 104   CO2 '25 24 24  '$ GLUCOSE 379* 171* 221*  BUN '15 11 8  '$ CREATININE 1.11 0.66 0.73  CALCIUM 8.8* 9.0 9.1  PROT 6.7  --   --   ALBUMIN 4.1  --   --   AST 57*  --   --   ALT 74*  --   --   ALKPHOS 68  --   --   BILITOT 0.4  --   --   GFRNONAA >60 >60 >60  GFRAA >60 >60 >60  ANIONGAP '11 9 11     '$ Hematology Recent Labs Lab 08/31/16 2008 09/01/16 0334 09/02/16 0238  WBC 7.6 9.5 7.8  RBC 5.14 5.09 5.22  HGB 13.8 13.7 14.1  HCT 41.8 41.0 42.9  MCV 81.3 80.6 82.2  MCH 26.8 26.9 27.0  MCHC 33.0 33.4 32.9  RDW 13.3 13.2 13.5  PLT 148* 163 157    Cardiac Enzymes Recent Labs Lab 08/31/16 2008 08/31/16 2233 09/01/16 0334 09/01/16 0926  TROPONINI <0.03 2.74* 13.85* 12.82*    Recent  Labs Lab 08/31/16 2014  TROPIPOC 0.00     BNPNo results for input(s): BNP, PROBNP in the last 168 hours.   DDimer No results for input(s): DDIMER in the last 168 hours.   Radiology    No results found.  Cardiac Studies    Lat 1st Mrg lesion, 75 %stenosed.  Mid LAD lesion, 40 %stenosed.  There is mild left ventricular systolic dysfunction.  LV end diastolic pressure is normal.  The left ventricular ejection fraction is 45-50% by visual estimate.  A STENT PROMUS PREM MR 3.5X38 drug eluting stent was successfully placed.  Prox RCA lesion, 100 %stenosed.  Post intervention, there is a 0% residual stenosis.   1. 2 vessel obstructive CAD    - 100% proximal RCA    - 75% branch of OM1 2. Mild LV dysfunction 3. Normal LVEDP 4. Successful stenting of the proximal to mid RCA with DES.  Plan: DAPT for one year. Statin therapy. Beta blocker. He may be a candidate for fast track discharge if no complications. If he has recurrent angina consider stenting of OM1 branch.   Indications   ST elevation myocardial infarction involving right coronary artery (HCC) [I21.11 (ICD-10-CM)]  Procedural Details/Technique   Technical Details Indication: 41 yo male presents with an inferior  STEMI  Procedural Details: The right wrist was prepped, draped, and anesthetized with 1% lidocaine. Using the modified Seldinger technique, a 6 French slender sheath was introduced into the right radial artery. 3 mg of verapamil was administered through the sheath, weight-based unfractionated heparin was administered intravenously. Standard Judkins catheters were used for selective coronary angiography and left ventriculography. Catheter exchanges were performed over an exchange length guidewire. Following diagnostic procedure we proceeded with PCI as noted below. There were no immediate procedural complications. A TR band was used for radial hemostasis at the completion of the procedure. The patient was transferred to the post catheterization recovery area for further monitoring. Contrast: 135 cc   Estimated blood loss <50 mL.  During this procedure no sedation was administered.    Complications   Complications documented before study signed (08/31/2016 9:11 PM EDT)    No complications were associated with this study.  Documented by Peter M Martinique, MD - 08/31/2016 9:09 PM EDT    Coronary Findings   Dominance: Right  Left Main  Vessel was injected. Vessel is normal in caliber. Vessel is angiographically normal.  Left Anterior Descending  Mid LAD lesion, 40% stenosed.  Left Circumflex  Lateral First Obtuse Marginal Branch  Lat 1st Mrg lesion, 75% stenosed.  Third Obtuse Marginal Branch  Vessel is small in size.  Right Coronary Artery  Prox RCA lesion, 100% stenosed.  Angioplasty: Lesion crossed with guidewire using a WIRE ASAHI PROWATER 180CM. Pre-stent angioplasty was performed using a BALLOON EUPHORA RX2.5X12. Maximum pressure: 8 atm. A STENT PROMUS PREM MR 3.5X38 drug eluting stent was successfully placed. Stent strut is well apposed. Post-stent angioplasty was performed using a BALLOON [place] EMERGE MR 3.5X20. Maximum pressure: 18 atm. There is no pre-interventional antegrade distal flow  (TIMI 0). The post-interventional distal flow is normal (TIMI 3). The intervention was successful . No complications occurred at this lesion.  There is no residual stenosis post intervention.  Wall Motion              Left Heart   Left Ventricle The left ventricular size is normal. There is mild left ventricular systolic dysfunction. LV end diastolic pressure is normal. The left ventricular ejection fraction is 45-50%  by visual estimate. There are LV function abnormalities due to segmental dysfunction.    Coronary Diagrams   Diagnostic Diagram       Post-Intervention Diagram       Implants     Permanent Stent  Stent Promus Prem Mr 3.5x38      ECHO 09/01/16 - Left ventricle: The cavity size was normal. Systolic function was   normal. The estimated ejection fraction was 55%. Wall motion was   normal; there were no regional wall motion abnormalities. Left   ventricular diastolic function parameters were normal. - Aortic valve: Trileaflet; normal thickness leaflets. There was no   regurgitation. - Aortic root: The aortic root was normal in size. - Mitral valve: Transvalvular velocity was within the normal range.   There was no evidence for stenosis. There was no regurgitation. - Right ventricle: Systolic function was normal. - Right atrium: The atrium was normal in size. - Tricuspid valve: There was trivial regurgitation. - Pulmonic valve: There was no regurgitation. - Pulmonary arteries: Systolic pressure was within the normal   range. - Inferior vena cava: The vessel was normal in size. - Pericardium, extracardiac: There was no pericardial effusion.  Impressions:  - Normal study. Normal global longitudinal strain - 22.7%.  Patient Profile     41 y.o. male with inferior STEMI, RCA stent, DM with HTN uncontrolled  Assessment & Plan    Inferior STEMI  - prox RCA DES.   - OM 75% branch.   - ECHO EF normal, cath mildly reduced.   - DAPT  - Statin (LDL 118)   - metop  DM with HTN  - metop  - oral agent, start metformin 500 po QD.   - A1c 11  - needs PCP follow up  TOC visit with [place] in 1 week.   Signed, Candee Furbish, MD  09/02/2016, 12:53 PM

## 2016-09-02 NOTE — Discharge Summary (Signed)
Discharge Summary    Patient ID: Scott Mcclain,  MRN: [id number], DOB/AGE: 10-Jun-1975 41 y.o.  Admit date: 08/31/2016 Discharge date: 09/02/2016  Primary Care Provider: No primary care provider on file. Primary Cardiologist: Dr. Martinique  Discharge Diagnoses    Active Problems:   STEMI involving right coronary artery [facility])   CAD in native artery   Essential hypertension   Uncontrolled diabetes mellitus ([place])   Ischemic cardiomyopathy    Diagnostic Studies/Procedures    Coronary Stent Intervention  Left Heart Cath and Coronary Angiography  Conclusion    Lat 1st Mrg lesion, 75 %stenosed.  Mid LAD lesion, 40 %stenosed.  There is mild left ventricular systolic dysfunction.  LV end diastolic pressure is normal.  The left ventricular ejection fraction is 45-50% by visual estimate.  A STENT PROMUS PREM MR 3.5X38 drug eluting stent was successfully placed.  Prox RCA lesion, 100 %stenosed.  Post intervention, there is a 0% residual stenosis.   1. 2 vessel obstructive CAD    - 100% proximal RCA    - 75% branch of OM1 2. Mild LV dysfunction 3. Normal LVEDP 4. Successful stenting of the proximal to mid RCA with DES.  Plan: DAPT for one year. Statin therapy. Beta blocker. He may be a candidate for fast track discharge if no complications. If he has recurrent angina consider stenting of OM1 branch.   _____________     History of Present Illness     Scott Mcclain is a 41 y.o. male with history of DM, HTN who presented to [facility] with inferior STEMI. He was at work earlier day of admission when he had sudden onset chest pain the he described as a squeezing sensation. He also felt short of breath and diaphoretic. No nausea/vomiting or syncope. ECG findings consistent with inferior STEMI. He was taken emergently to the cath lab.   Hospital Course    [name] 08/31/16 showed 100% prox RCA, 75% OM1, mild LV dysfunction EF 45-50%, normal LVEDP -> s/p successful stenting of prox-mid  RCA with DES.  He was placed on DAPT. He was treated with standard post MI care with BB, statin. Per cath note, if he has recurrent angina consider stenting of OM1 branch. 2D Echo 09/01/16: EF 55%, no RWMA, essentially normal. Peak troponin 13.85.  Other issues noted this admission: - Brilinta assistance form filled out, rec'd 30 day free card with separate rx and refills sent in as usual - A1c 11 -> Patient provided with instructions to call [facility] on Monday to schedule follow up and set up PCP. Continued on metformin and given rx for glucometer, lancets and supplies in case he did not have this. Asked to check CBGs ac+HS. -LDL was 118. He did have mildly elevated AST and ALT on admission of 57/74, may have been due to MI. F/u LFTs pending at time of DC - asked lab to add them on. He was started on statin this admission. Would recommend recheck as OP if these are abnormal. If the patient is tolerating statin at time of follow-up appointment, would consider rechecking liver function/lipid panel in 6-8 weeks.  The patient feels well today. Dr. Marlou Porch has seen and examined the patient today and feels she is stable for discharge. I have sent a message to our [facility] office's scheduler requesting a follow-up appointment, and our office will call the patient with this information.   _____________  Discharge Vitals Blood pressure 107/62, pulse 73, temperature 98.2 F (36.8 C), temperature source Oral, resp. rate  18, height '5\' 5"'$  (1.651 m), weight 159 lb 9.6 oz (72.4 kg), SpO2 99 %.  Filed Weights   08/31/16 2006 09/02/16 0459  Weight: 170 lb (77.1 kg) 159 lb 9.6 oz (72.4 kg)    Labs & Radiologic Studies    CBC  Recent Labs  08/31/16 2008 09/01/16 0334 09/02/16 0238  WBC 7.6 9.5 7.8  NEUTROABS 4.7  --   --   HGB 13.8 13.7 14.1  HCT 41.8 41.0 42.9  MCV 81.3 80.6 82.2  PLT 148* 163 191   Basic Metabolic Panel  Recent Labs  09/01/16 0334 09/02/16 0238  NA 137 139  K 3.3* 3.5  CL 104  104  CO2 24 24  GLUCOSE 171* 221*  BUN 11 8  CREATININE 0.66 0.73  CALCIUM 9.0 9.1   Liver Function Tests  Recent Labs  08/31/16 2008  AST 57*  ALT 74*  ALKPHOS 68  BILITOT 0.4  PROT 6.7  ALBUMIN 4.1   No results for input(s): LIPASE, AMYLASE in the last 72 hours. Cardiac Enzymes  Recent Labs  08/31/16 2233 09/01/16 0334 09/01/16 0926  TROPONINI 2.74* 13.85* 12.82*   Hemoglobin A1C  Recent Labs  09/01/16 0334  HGBA1C 11.0*   Fasting Lipid Panel  Recent Labs  09/01/16 0334  CHOL 187  HDL 51  LDLCALC 118*  TRIG 89  CHOLHDL 3.7   _____________  No results found. Disposition   Pt is being discharged home today in good condition.  Follow-up Plans & Appointments    Follow-up Information    [place]. Call.   Why:  Please call at 9:00 on Monday to schedule a follow up appointment and establish a primary care doctor. Contact information: [place] [phone]       Medication Assistance Follow up.   Why:  Complete application for Brilinta immediately upon discharge and submit as instructed on first page. You have a 30 day supply for free but will need medication for longer. You must tell your MD before you run out of Brilinta if you can't get more.        Peter Martinique, MD Follow up.   Specialty:  Cardiology Why:  Our office will call you for a follow-up appointment. Please call the office if you have not heard from [place] within 3 days.  Contact information: [address] [place] [address] [phone]          Discharge Instructions    Amb Referral to Cardiac Rehabilitation    Complete by:  As directed    Diagnosis:   Coronary Stents PTCA STEMI     Diet - low sodium heart healthy    Complete by:  As directed    Increase activity slowly    Complete by:  As directed    No driving for 2 weeks. No lifting over 10 lbs for 4 weeks. No sexual  activity for 4 weeks. You may not return to work until cleared by your cardiologist - see work note. Keep procedure site clean & dry. If you notice increased pain, swelling, bleeding or pus, call/return!  You may shower, but no soaking baths/hot tubs/pools for 1 week.   Check your blood sugars three times a day with meals and at bedtime. Keep a log and bring it to your doctor's appointments.      Discharge Medications   Allergies as of 09/02/2016   No Known Allergies  Medication List    TAKE these medications   aspirin 81 MG EC tablet Take 1 tablet (81 mg total) by mouth daily. Start taking on:  09/03/2016   atorvastatin 80 MG tablet Commonly known as:  LIPITOR Take 1 tablet (80 mg total) by mouth every evening. What changed:  medication strength  how much to take  when to take this   metFORMIN 500 MG tablet Commonly known as:  GLUCOPHAGE Take 500 mg by mouth 2 (two) times daily with a meal.   metoprolol tartrate 25 MG tablet Commonly known as:  LOPRESSOR Take 1 tablet (25 mg total) by mouth 2 (two) times daily.   nitroGLYCERIN 0.4 MG SL tablet Commonly known as:  NITROSTAT Place 1 tablet (0.4 mg total) under the tongue every 5 (five) minutes as needed for chest pain (up to 3 doses).   ticagrelor 90 MG Tabs tablet Commonly known as:  BRILINTA Take 1 tablet (90 mg total) by mouth 2 (two) times daily.        Allergies:  No Known Allergies  Aspirin prescribed at discharge?  Yes High Intensity Statin Prescribed? (Lipitor 40-'80mg'$  or Crestor 20-'40mg'$ ): Yes Beta Blocker Prescribed? Yes For EF <40%, was ACEI/ARB Prescribed? No: na ADP Receptor Inhibitor Prescribed? (i.e. Plavix etc.-Includes Medically Managed Patients): Yes For EF <40%, Aldosterone Inhibitor Prescribed? No: na Was EF assessed during THIS hospitalization? Yes Was Cardiac Rehab II ordered? (Included Medically managed Patients): Yes   Outstanding Labs/Studies   LFTs pending at time of  DC.  Duration of Discharge Encounter   Greater than 30 minutes including physician time.  Signed, Nedra Hai Dunn PA-C 09/02/2016, 3:26 PM  Personally seen and examined. Agree with above. Primary Cardiologist: Martin[place]   Subjective   Feels great. No CP, no SOB. Family at bedside. Ambulated well.  Inpatient Medications    Scheduled Meds: . aspirin EC  81 mg Oral Daily  . atorvastatin  80 mg Oral q1800  . heparin  5,000 Units Subcutaneous Q8H  . insulin aspart  0-15 Units Subcutaneous TID WC  . insulin aspart  0-5 Units Subcutaneous QHS  . insulin aspart  4 Units Subcutaneous TID WC  . metoprolol tartrate  25 mg Oral BID  . sodium chloride flush  3 mL Intravenous Q12H  . ticagrelor  90 mg Oral BID   Continuous Infusions: PRN Meds: sodium chloride, acetaminophen, hydrALAZINE, nitroGLYCERIN, ondansetron (ZOFRAN) IV, sodium chloride flush   Vital Signs          Vitals:   09/01/16 1321 09/01/16 2005 09/02/16 0459 09/02/16 1001  BP: 106/64 105/65 100/67 (!) 90/53  Pulse:  80 83 76  Resp: '18 16 16   '$ Temp: 98.2 F (36.8 C) 98.2 F (36.8 C) 98.1 F (36.7 C)   TempSrc: Oral Oral Oral   SpO2: 100% 99% 99%   Weight:   159 lb 9.6 oz (72.4 kg)   Height:        Intake/Output Summary (Last 24 hours) at 09/02/16 1253 Last data filed at 09/02/16 1002  Gross per 24 hour  Intake              603 ml  Output             1000 ml  Net             -397 ml       Filed Weights   08/31/16 2006 09/02/16 0459  Weight: 170 lb (77.1 kg) 159 lb 9.6 oz (72.4 kg)  Telemetry    No adverse rhythms - Personally Reviewed  ECG    09/01/16 - NSR with mild TWI diffuse - Personally Reviewed  Physical Exam   GEN:No acute distress.   Neck:No JVD Cardiac:RRR, no murmurs, rubs, or gallops.  Respiratory:Clear to auscultation bilaterally. VQ:QVZD, nontender, non-distended  MS:No edema; No deformity. Radial site normal, good pulse Neuro:Nonfocal  Psych:  Normal affect   Labs    Chemistry Last Labs    Recent Labs Lab 08/31/16 2008 09/01/16 0334 09/02/16 0238  NA 133* 137 139  K 3.6 3.3* 3.5  CL 97* 104 104  CO2 '25 24 24  '$ GLUCOSE 379* 171* 221*  BUN '15 11 8  '$ CREATININE 1.11 0.66 0.73  CALCIUM 8.8* 9.0 9.1  PROT 6.7  --   --   ALBUMIN 4.1  --   --   AST 57*  --   --   ALT 74*  --   --   ALKPHOS 68  --   --   BILITOT 0.4  --   --   GFRNONAA >60 >60 >60  GFRAA >60 >60 >60  ANIONGAP '11 9 11       '$ Hematology Last Labs    Recent Labs Lab 08/31/16 2008 09/01/16 0334 09/02/16 0238  WBC 7.6 9.5 7.8  RBC 5.14 5.09 5.22  HGB 13.8 13.7 14.1  HCT 41.8 41.0 42.9  MCV 81.3 80.6 82.2  MCH 26.8 26.9 27.0  MCHC 33.0 33.4 32.9  RDW 13.3 13.2 13.5  PLT 148* 163 157      Cardiac Enzymes Last Labs    Recent Labs Lab 08/31/16 2008 08/31/16 2233 09/01/16 0334 09/01/16 0926  TROPONINI <0.03 2.74* 13.85* 12.82*      Recent Labs Lab 08/31/16 2014  TROPIPOC 0.00     BNP Last Labs   No results for input(s): BNP, PROBNP in the last 168 hours.     DDimer  Last Labs   No results for input(s): DDIMER in the last 168 hours.     Radiology    Imaging Results (Last 48 hours)  No results found.    Cardiac Studies    Lat 1st Mrg lesion, 75 %stenosed.  Mid LAD lesion, 40 %stenosed.  There is mild left ventricular systolic dysfunction.  LV end diastolic pressure is normal.  The left ventricular ejection fraction is 45-50% by visual estimate.  A STENT PROMUS PREM MR 3.5X38 drug eluting stent was successfully placed.  Prox RCA lesion, 100 %stenosed.  Post intervention, there is a 0% residual stenosis.  1. 2 vessel obstructive CAD - 100% proximal RCA - 75% branch of OM1 2. Mild LV dysfunction 3. Normal LVEDP 4. Successful stenting of the proximal to mid RCA with DES.  Plan: DAPT for one year. Statin therapy. Beta blocker. He may be a candidate for fast track discharge if no  complications. If he has recurrent angina consider stenting of OM1 branch.   Indications   ST elevation myocardial infarction involving right coronary artery (HCC) [I21.11 (ICD-10-CM)]  Procedural Details/Technique   Technical Details Indication: 41 yo male presents with an inferior STEMI  Procedural Details: The right wrist was prepped, draped, and anesthetized with 1% lidocaine. Using the modified Seldinger technique, a 6 French slender sheath was introduced into the right radial artery. 3 mg of verapamil was administered through the sheath, weight-based unfractionated heparin was administered intravenously. Standard Judkins catheters were used for selective coronary angiography and left ventriculography. Catheter exchanges were performed over an exchange length  guidewire. Following diagnostic procedure we proceeded with PCI as noted below. There were no immediate procedural complications. A TR band was used for radial hemostasis at the completion of the procedure. The patient was transferred to the post catheterization recovery area for further monitoring. Contrast: 135 cc   Estimated blood loss <50 mL.  During this procedure no sedation was administered.    Complications   Complications documented before study signed (08/31/2016 9:11 PM EDT)    No complications were associated with this study.  Documented by Peter M Martinique, MD - 08/31/2016 9:09 PM EDT    Coronary Findings   Dominance: Right  Left Main  Vessel was injected. Vessel is normal in caliber. Vessel is angiographically normal.  Left Anterior Descending  Mid LAD lesion, 40% stenosed.  Left Circumflex  Lateral First Obtuse Marginal Branch  Lat 1st Mrg lesion, 75% stenosed.  Third Obtuse Marginal Branch  Vessel is small in size.  Right Coronary Artery  Prox RCA lesion, 100% stenosed.  Angioplasty: Lesion crossed with guidewire using a WIRE ASAHI PROWATER 180CM. Pre-stent angioplasty was performed using a BALLOON  EUPHORA RX2.5X12. Maximum pressure: 8 atm. A STENT PROMUS PREM MR 3.5X38 drug eluting stent was successfully placed. Stent strut is well apposed. Post-stent angioplasty was performed using a BALLOON [place] EMERGE MR 3.5X20. Maximum pressure: 18 atm. There is no pre-interventional antegrade distal flow (TIMI 0). The post-interventional distal flow is normal (TIMI 3). The intervention was successful . No complications occurred at this lesion.  There is no residual stenosis post intervention.  Wall Motion              Left Heart   Left Ventricle The left ventricular size is normal. There is mild left ventricular systolic dysfunction. LV end diastolic pressure is normal. The left ventricular ejection fraction is 45-50% by visual estimate. There are LV function abnormalities due to segmental dysfunction.    Coronary Diagrams   Diagnostic Diagram       Post-Intervention Diagram       Implants     Permanent Stent  Stent Promus Prem Mr 3.5x38      ECHO 09/01/16 - Left ventricle: The cavity size was normal. Systolic function was normal. The estimated ejection fraction was 55%. Wall motion was normal; there were no regional wall motion abnormalities. Left ventricular diastolic function parameters were normal. - Aortic valve: Trileaflet; normal thickness leaflets. There was no regurgitation. - Aortic root: The aortic root was normal in size. - Mitral valve: Transvalvular velocity was within the normal range. There was no evidence for stenosis. There was no regurgitation. - Right ventricle: Systolic function was normal. - Right atrium: The atrium was normal in size. - Tricuspid valve: There was trivial regurgitation. - Pulmonic valve: There was no regurgitation. - Pulmonary arteries: Systolic pressure was within the normal range. - Inferior vena cava: The vessel was normal in size. - Pericardium, extracardiac: There was no pericardial  effusion.  Impressions:  - Normal study. Normal global longitudinal strain - 22.7%.  Patient Profile     41 y.o. male with inferior STEMI, RCA stent, DM with HTN uncontrolled  Assessment & Plan    Inferior STEMI  - prox RCA DES.   - OM 75% branch.   - ECHO EF normal, cath mildly reduced.   - DAPT  - Statin (LDL 118)  - metop  DM with HTN  - metop  - oral agent, start metformin 500 po QD.   - A1c 11  - needs  PCP follow up  TOC visit with [place] in 1 week.   Signed, Candee Furbish, MD

## 2016-09-02 NOTE — Progress Notes (Signed)
CARDIAC REHAB PHASE I   PRE:  Rate/Rhythm: 99  BP:  Sitting: 102/65     SaO2: 97% ra  MODE:  Ambulation: 400 ft   POST:  Rate/Rhythm: 95  BP:  Sitting: 97/66     SaO2: 99ra  9:24am-9:44am Patient ambulated independently with no complaints. Stated it felt good to get up and move. Re-educated on Cardiac Rehab Phase 2 importance. Patient returned to bed with daughter at bedside.  Barnabas Lister Delano Scardino, MS 09/02/2016 9:43 AM

## 2016-09-04 NOTE — Telephone Encounter (Signed)
Called [facility], Spoke with Hessie Diener who confirmed Mr. Scott Mcclain picked up his rx for Brilinta Saturday night.  [employer], Spoke with the pharmacist who confirmed they did receive the message to cancel the rx Brilinta.

## 2016-09-05 NOTE — Progress Notes (Signed)
Cardiology Office Note    Date:  09/06/2016   ID:  Scott Mcclain, DOB 14-Mar-1976, MRN [id number]  PCP:  No PCP Per Patient  Cardiologist:  Kweku Stankey Martinique, MD    History of Present Illness:  Scott Mcclain is a 41 y.o. male seen for post hospital follow up. He has a history of DM type 2, HTN, HLD. He presented on August 31, 2016 with an acute inferior STEMI. He had emergent stenting of the proximal to mid RCA with DES. He had residual 75% OM lesion treated medically. EF was 45-50% by cath but normal by later Echo. Peak troponin was 13.85. He was treated with ASA and Brilinta. Discharged on metoprolol, high dose statin, and metformin. A1c was 11% and LDL 118.   On follow up today he is doing very well. Denies any chest pain or SOB. No edema or palpitations. He has worked in [employer]. Is going to be seen in the [facility] today for his meds and diabetes management. Tolerating his medication well.    Past Medical History:  Diagnosis Date  . CAD in native artery    a. Inf STEMI 08/2016: [place] 08/31/16 showed 100% prox RCA, 75% OM1, mild LV dysfunction EF 45-50%, normal LVEDP -> s/p successful stenting of prox-mid RCA with DES, consider PCI of OM1 if recurrent angina. EF 55% by f/u echo same admission.  . Hyperlipidemia   . Hypertension   . Uncontrolled diabetes mellitus ([place])     Past Surgical History:  Procedure Laterality Date  . CORONARY STENT INTERVENTION N/A 08/31/2016   Procedure: Coronary Stent Intervention;  Surgeon: Angeliki Mates M Martinique, MD;  Location: [facility];  Service: Cardiovascular;  Laterality: N/A;  . LEFT HEART CATH AND CORONARY ANGIOGRAPHY N/A 08/31/2016   Procedure: Left Heart Cath and Coronary Angiography;  Surgeon: Setareh Rom M Martinique, MD;  Location: [facility];  Service: Cardiovascular;  Laterality: N/A;    Current Medications: Outpatient Medications Prior to Visit  Medication Sig Dispense Refill  . aspirin EC 81 MG EC tablet Take 1 tablet (81 mg  total) by mouth daily. 90 tablet 3  . atorvastatin (LIPITOR) 80 MG tablet Take 1 tablet (80 mg total) by mouth every evening. 30 tablet 6  . metoprolol tartrate (LOPRESSOR) 25 MG tablet Take 1 tablet (25 mg total) by mouth 2 (two) times daily. 60 tablet 6  . nitroGLYCERIN (NITROSTAT) 0.4 MG SL tablet Place 1 tablet (0.4 mg total) under the tongue every 5 (five) minutes as needed for chest pain (up to 3 doses). 25 tablet 3  . ticagrelor (BRILINTA) 90 MG TABS tablet Take 1 tablet (90 mg total) by mouth 2 (two) times daily. 180 tablet 3  . metFORMIN (GLUCOPHAGE) 500 MG tablet Take 500 mg by mouth 2 (two) times daily with a meal.     No facility-administered medications prior to visit.      Allergies:   Patient has no known allergies.   Social History   Social History  . Marital status: Married    Spouse name: N/A  . Number of children: N/A  . Years of education: N/A   Social History Main Topics  . Smoking status: Never Smoker  . Smokeless tobacco: Never Used  . Alcohol use Yes     Comment: occ  . Drug use: No  . Sexual activity: Not Asked   Other Topics Concern  . None   Social History Narrative  . None     Family  History:  The patient's family history includes Diabetes in his mother.   ROS:   Please see the history of present illness.    ROS All other systems reviewed and are negative.   PHYSICAL EXAM:   VS:  BP 107/67 (BP Location: Left Arm)   Pulse 69   Ht 5\' 5"  (1.651 m)   Wt 160 lb 3.2 oz (72.7 kg)   BMI 26.66 kg/m    GEN: Well nourished, well developed, in no acute distress  HEENT: normal  Neck: no JVD, carotid bruits, or masses Cardiac: RRR; no murmurs, rubs, or gallops,no edema  Respiratory:  clear to auscultation bilaterally, normal work of breathing GI: soft, nontender, nondistended, + BS MS: no deformity or atrophy . No radial site hematoma. Skin: warm and dry, no rash Neuro:  Alert and Oriented x 3, Strength and sensation are intact Psych: euthymic  mood, full affect  Wt Readings from Last 3 Encounters:  09/06/16 160 lb 3.2 oz (72.7 kg)  09/02/16 159 lb 9.6 oz (72.4 kg)      Studies/Labs Reviewed:   EKG:  EKG is not ordered today.  The ekg ordered today demonstrates N/A  Recent Labs: 09/02/2016: ALT 86; BUN 8; Creatinine, Ser 0.73; Hemoglobin 14.1; Platelets 157; Potassium 3.5; Sodium 139   Lipid Panel    Component Value Date/Time   CHOL 187 09/01/2016 0334   TRIG 89 09/01/2016 0334   HDL 51 09/01/2016 0334   CHOLHDL 3.7 09/01/2016 0334   VLDL 18 09/01/2016 0334   LDLCALC 118 (H) 09/01/2016 0334    Additional studies/ records that were reviewed today include:  Cardiac cath 08/31/16:   Lat 1st Mrg lesion, 75 %stenosed.  Mid LAD lesion, 40 %stenosed.  There is mild left ventricular systolic dysfunction.  LV end diastolic pressure is normal.  The left ventricular ejection fraction is 45-50% by visual estimate.  A STENT PROMUS PREM MR 3.5X38 drug eluting stent was successfully placed.  Prox RCA lesion, 100 %stenosed.  Post intervention, there is a 0% residual stenosis.  1. 2 vessel obstructive CAD - 100% proximal RCA - 75% branch of OM1 2. Mild LV dysfunction 3. Normal LVEDP 4. Successful stenting of the proximal to mid RCA with DES.  Plan: DAPT for one year. Statin therapy. Beta blocker. He may be a candidate for fast track discharge if no complications. If he has recurrent angina consider stenting of OM1 branch.   Indications   ST elevation myocardial infarction involving right coronary artery (HCC) [I21.11 (ICD-10-CM)]  Procedural Details/Technique   Technical Details Indication: 41 yo male presents with an inferior STEMI  Procedural Details: The right wrist was prepped, draped, and anesthetized with 1% lidocaine. Using the modified Seldinger technique, a 6 French slender sheath was introduced into the right radial artery. 3 mg of verapamil was administered through the sheath, weight-based  unfractionated heparin was administered intravenously. Standard Judkins catheters were used for selective coronary angiography and left ventriculography. Catheter exchanges were performed over an exchange length guidewire. Following diagnostic procedure we proceeded with PCI as noted below. There were no immediate procedural complications. A TR band was used for radial hemostasis at the completion of the procedure. The patient was transferred to the post catheterization recovery area for further monitoring. Contrast: 135 cc   Estimated blood loss <50 mL.  During this procedure no sedation was administered.    Complications   Complications documented before study signed (08/31/2016 9:11 PM EDT)    No complications were associated with this  study.  Documented by Julio Storr M Martinique, MD - 08/31/2016 9:09 PM EDT    Coronary Findings   Dominance: Right  Left Main  Vessel was injected. Vessel is normal in caliber. Vessel is angiographically normal.  Left Anterior Descending  Mid LAD lesion, 40% stenosed.  Left Circumflex  Lateral First Obtuse Marginal Branch  Lat 1st Mrg lesion, 75% stenosed.  Third Obtuse Marginal Branch  Vessel is small in size.  Right Coronary Artery  Prox RCA lesion, 100% stenosed.  Angioplasty: Lesion crossed with guidewire using a WIRE ASAHI PROWATER 180CM. Pre-stent angioplasty was performed using a BALLOON EUPHORA RX2.5X12. Maximum pressure: 8 atm. A STENT PROMUS PREM MR 3.5X38 drug eluting stent was successfully placed. Stent strut is well apposed. Post-stent angioplasty was performed using a BALLOON [place] EMERGE MR 3.5X20. Maximum pressure: 18 atm. There is no pre-interventional antegrade distal flow (TIMI 0). The post-interventional distal flow is normal (TIMI 3). The intervention was successful . No complications occurred at this lesion.  There is no residual stenosis post intervention.  Wall Motion              Left Heart   Left Ventricle The left  ventricular size is normal. There is mild left ventricular systolic dysfunction. LV end diastolic pressure is normal. The left ventricular ejection fraction is 45-50% by visual estimate. There are LV function abnormalities due to segmental dysfunction.    Coronary Diagrams   Diagnostic Diagram       Post-Intervention Diagram       Implants     Permanent Stent  Stent Promus Prem Mr 3.5x38      ECHO 09/01/16 - Left ventricle: The cavity size was normal. Systolic function was normal. The estimated ejection fraction was 55%. Wall motion was normal; there were no regional wall motion abnormalities. Left ventricular diastolic function parameters were normal. - Aortic valve: Trileaflet; normal thickness leaflets. There was no regurgitation. - Aortic root: The aortic root was normal in size. - Mitral valve: Transvalvular velocity was within the normal range. There was no evidence for stenosis. There was no regurgitation. - Right ventricle: Systolic function was normal. - Right atrium: The atrium was normal in size. - Tricuspid valve: There was trivial regurgitation. - Pulmonic valve: There was no regurgitation. - Pulmonary arteries: Systolic pressure was within the normal range. - Inferior vena cava: The vessel was normal in size. - Pericardium, extracardiac: There was no pericardial effusion.  Impressions:  - Normal study. Normal global longitudinal strain - 22.7%.    ASSESSMENT:    1. STEMI involving right coronary artery ([place])   2. Essential hypertension   3. CAD in native artery   4. Uncontrolled type 2 diabetes mellitus without complication, without long-term current use of insulin ([place])      PLAN:  In order of problems listed above:  1. Subsequent care. Doing well post PCI with early reperfusion. No recurrent angina. Continue DAPT for one year. Continue ASA and high dose statin. Follow up with extender in 2 months.  2. BP well controlled on  metoprolol 3. [facility]. States he didn't take his metformin for the last week because he was confused about his medication. Medications reviewed and he will resume 4. On high dose statin. Plan repeat lab in 2 months.    Medication Adjustments/Labs and Tests Ordered: Current medicines are reviewed at length with the patient today.  Concerns regarding medicines are outlined above.  Medication changes, Labs  and Tests ordered today are listed in the Patient Instructions below. Patient Instructions  Continue your current therapy  You may return to work next week.  We will arrange follow up here in 2 months.      Signed, Evora Schechter Martinique, MD  09/06/2016 9:37 AM    [facility] [address], [place], [place], [address] [phone]

## 2016-09-06 ENCOUNTER — Ambulatory Visit (INDEPENDENT_AMBULATORY_CARE_PROVIDER_SITE_OTHER): Payer: Self-pay | Admitting: Cardiology

## 2016-09-06 ENCOUNTER — Encounter: Payer: Self-pay | Admitting: Cardiology

## 2016-09-06 ENCOUNTER — Ambulatory Visit: Payer: Self-pay | Attending: Internal Medicine | Admitting: Physician Assistant

## 2016-09-06 VITALS — BP 111/76 | HR 67 | Temp 98.0°F | Ht 65.0 in | Wt 161.4 lb

## 2016-09-06 VITALS — BP 107/67 | HR 69 | Ht 65.0 in | Wt 160.2 lb

## 2016-09-06 DIAGNOSIS — I2111 ST elevation (STEMI) myocardial infarction involving right coronary artery: Secondary | ICD-10-CM

## 2016-09-06 DIAGNOSIS — Z79899 Other long term (current) drug therapy: Secondary | ICD-10-CM | POA: Insufficient documentation

## 2016-09-06 DIAGNOSIS — E1165 Type 2 diabetes mellitus with hyperglycemia: Secondary | ICD-10-CM

## 2016-09-06 DIAGNOSIS — I10 Essential (primary) hypertension: Secondary | ICD-10-CM

## 2016-09-06 DIAGNOSIS — I2510 Atherosclerotic heart disease of native coronary artery without angina pectoris: Secondary | ICD-10-CM | POA: Insufficient documentation

## 2016-09-06 DIAGNOSIS — Z7984 Long term (current) use of oral hypoglycemic drugs: Secondary | ICD-10-CM | POA: Insufficient documentation

## 2016-09-06 DIAGNOSIS — I252 Old myocardial infarction: Secondary | ICD-10-CM | POA: Insufficient documentation

## 2016-09-06 DIAGNOSIS — IMO0001 Reserved for inherently not codable concepts without codable children: Secondary | ICD-10-CM

## 2016-09-06 DIAGNOSIS — Z7982 Long term (current) use of aspirin: Secondary | ICD-10-CM | POA: Insufficient documentation

## 2016-09-06 LAB — GLUCOSE, POCT (MANUAL RESULT ENTRY): POC GLUCOSE: 230 mg/dL — AB (ref 70–99)

## 2016-09-06 MED ORDER — TRUE METRIX METER W/DEVICE KIT: PACK | 0 refills | Status: DC

## 2016-09-06 MED ORDER — GLUCOSE BLOOD VI STRP: ORAL_STRIP | 12 refills | Status: DC

## 2016-09-06 MED ORDER — TRUEPLUS LANCETS 28G MISC: 1 refills | Status: DC

## 2016-09-06 MED ORDER — METFORMIN HCL 1000 MG PO TABS: 1000.0000 mg | ORAL_TABLET | Freq: Two times a day (BID) | ORAL | 3 refills | Status: DC

## 2016-09-06 MED FILL — TRUEplus LANCETS 28G MISC: 25 days supply | Qty: 100 | Fill #0

## 2016-09-06 MED FILL — TRUE METRIX BLOOD GLUCOSE M: W/DEVICE | 1 days supply | Qty: 1 | Fill #0

## 2016-09-06 MED FILL — TRUE METRIX TEST STRIP: 25 days supply | Qty: 100 | Fill #0

## 2016-09-06 MED FILL — ?METFORMIN HCL 1,000 MG TAB: 1000 | 30 days supply | Qty: 60 | Fill #0

## 2016-09-06 NOTE — Progress Notes (Signed)
Scott Mcclain, is a 41 y.o. male  GPQ:[id number]  ENM:[id number]  DOB - 1976-01-23  Subjective:  Chief Complaint and HPI: Scott Mcclain is a 41 y.o. male here today to establish care and for a follow up visit after being hospitalized for STEMI 08/31/2016-09/02/2016. Saw cardiology this morning.  Denies any CP since hospitalization.  Patient presented to ED with CP, diaphoresis, SOB and had ST elevation on EKG and diagnosed with inferior wall STEMI and emergently taken to the cath lab.  Patient treated with medications  He was diagnosed with diabetes about 2 months ago in an "outpatient clinic"(not in Epic- and started on Metformin 579m bid.  He has not been taking metformin the last few days because he was confused about what medications he was supposed to be taking since the hospitalization.  He tolerated the metformin well without any problems.  He has not had regular health care in general the last several years.  ED/Hospital notes reviewed.  Basic summary: Left Heart Cath and Coronary Angiography  Conclusion    Lat 1st Mrg lesion, 75 %stenosed.  Mid LAD lesion, 40 %stenosed.  There is mild left ventricular systolic dysfunction.  LV end diastolic pressure is normal.  The left ventricular ejection fraction is 45-50% by visual estimate.  A STENT PROMUS PREM MR 3.5X38 drug eluting stent was successfully placed.  Prox RCA lesion, 100 %stenosed.  Post intervention, there is a 0% residual stenosis.  1. 2 vessel obstructive CAD - 100% proximal RCA - 75% branch of OM1 2. Mild LV dysfunction 3. Normal LVEDP 4. Successful stenting of the proximal to mid RCA with DES.  Plan: DAPT for one year. Statin therapy. Beta blocker. He may be a candidate for fast track discharge if no complications. If he has recurrent angina consider stenting of OM1 branch.      Social History:  Married, doesn't smoke, no cocaine or recreational drug use. Family history: dad  died at 61with MI.  ROS:   Constitutional:  No f/c, No night sweats, No unexplained weight loss. EENT:  No vision changes, No blurry vision, No hearing changes. No mouth, throat, or ear problems.  Respiratory: No cough, No SOB Cardiac: No CP, no palpitations GI:  No abd pain, No N/V/D. GU: No Urinary s/sx Musculoskeletal: No joint pain Neuro: No headache, no dizziness, no motor weakness.  Skin: No rash Endocrine:  No polydipsia. No polyuria.  Psych: Denies SI/HI  No problems updated.  ALLERGIES: No Known Allergies  PAST MEDICAL HISTORY: Past Medical History:  Diagnosis Date  . CAD in native artery    a. [place] [facility] showed 100% prox RCA, 75% OM1, mild LV dysfunction EF 45-50%, normal LVEDP -> s/p successful stenting of prox-mid RCA with DES, consider PCI of OM1 if recurrent angina. EF 55% by f/u echo same admission.  . Hyperlipidemia   . Hypertension   . [place]controlled diabetes mellitus ([place]     MEDICATIONS AT HOME: Prior to Admission medications   Medication Sig Start Date End Date Taking? Authorizing Provider  aspirin EC 81 MG EC tablet Take 1 tablet (81 mg total) by mouth daily. 09/03/16  Yes Dayna N Dunn, PA-C  atorvastatin (LIPITOR) 80 MG tablet Take 1 tablet (80 mg total) by mouth every evening. 09/02/16  Yes Dayna N Dunn, PA-C  metoprolol tartrate (LOPRESSOR) 25 MG tablet Take 1 tablet (25 mg total) by mouth 2 (two) times daily. 09/02/16  Yes Dayna N Dunn, PA-C  nitroGLYCERIN (NITROSTAT)  0.4 MG SL tablet Place 1 tablet (0.4 mg total) under the tongue every 5 (five) minutes as needed for chest pain (up to 3 doses). 09/02/16  Yes Dayna N Dunn, PA-C  ticagrelor (BRILINTA) 90 MG TABS tablet Take 1 tablet (90 mg total) by mouth 2 (two) times daily. 09/02/16  Yes Dayna N Dunn, PA-C  Blood Glucose Monitoring Suppl (TRUE METRIX METER) w/Device KIT Check blood sugar fasting and at bedtime 09/06/16   Argentina Donovan, PA-C  glucose blood (TRUE METRIX BLOOD GLUCOSE TEST)  test strip Use as instructed 09/06/16   Argentina Donovan, PA-C  metFORMIN (GLUCOPHAGE) 1000 MG tablet Take 1 tablet (1,000 mg total) by mouth 2 (two) times daily with a meal. 09/06/16   Argentina Donovan, PA-C  TRUEPLUS LANCETS 28G MISC Check blood sugars fasting and at bedtime and record 09/06/16   Argentina Donovan, PA-C     Objective:  EXAM:   Vitals:   09/06/16 1018  BP: 111/76  Pulse: 67  Temp: 98 F (36.7 C)  TempSrc: Oral  SpO2: 99%  Weight: 161 lb 6.4 oz (73.2 kg)  Height: _0  (1.651 m)    General appearance : A&OX3. NAD. Non-toxic-appearing HEENT: Atraumatic and Normocephalic.  PERRLA. EOM intact.  TM clear B. Mouth-MMM, post pharynx WNL w/o erythema, No PND. Neck: supple, no JVD. No cervical lymphadenopathy. No thyromegaly Chest/Lungs:  Breathing-non-labored, Good air entry bilaterally, breath sounds normal without rales, rhonchi, or wheezing  CVS: S1 S2 regular, no murmurs, gallops, rubs  Abdomen: Bowel sounds present, Non tender and not distended with no gaurding, rigidity or rebound. Extremities: Bilateral Lower Ext shows no edema, both legs are warm to touch with = pulse throughout Neurology:  CN II-XII grossly intact, Non focal.   Psych:  TP linear. J/I WNL. Normal speech. Appropriate eye contact and affect.  Skin:  No Rash  Data Review Lab Results  Component Value Date   HGBA1C 11.0 (H) 09/01/2016   HGBA1C 6.2 (H) 05/25/2009     Assessment & Plan   1. Uncontrolled type 2 diabetes mellitus without complication, without long-term current use of insulin (HCC) Uncontrolled - POCT glucose (manual entry) restart- metFORMIN (GLUCOPHAGE) 1000 MG tablet; Take 1 tablet (1,000 mg total) by mouth 2 (two) times daily with a meal.  Dispense: 180 tablet; Refill: 3 - Blood Glucose Monitoring Suppl (TRUE METRIX METER) w/Device KIT; Check blood sugar fasting and at bedtime  Dispense: 1 kit; Refill: 0 - glucose blood (TRUE METRIX BLOOD GLUCOSE TEST) test strip; Use as  instructed  Dispense: 100 each; Refill: 12 - TRUEPLUS LANCETS 28G MISC; Check blood sugars fasting and at bedtime and record  Dispense: 100 each; Refill: 1  2. Essential hypertension Controlled  3. STEMI involving right coronary artery [facility]) Had cardiology f/up this morning and next appt is in 2 months.  For now, medical management with plan for stent if CP recurs.  Continue statin, Aspirin, BB.     Patient have been counseled extensively about nutrition and exercise  Return in about 3 weeks (around 09/27/2016) for assign PCP; f/up DM and post STEMI/lipids.  The patient was given clear instructions to go to ER or return to medical center if symptoms don't improve, worsen or new problems develop. The patient verbalized understanding. The patient was told to call to get lab results if they haven't heard anything in the next week.     Freeman Caldron, PA-C [facility] and [facility], [place]  09/06/2016, 11:03 AMPatient ID: Braulio Bosch, male   DOB: 1975-06-22, 41 y.o.   MRN: [id number]

## 2016-09-06 NOTE — Patient Instructions (Signed)
Continue your current therapy  You may return to work next week.  We will arrange follow up here in 2 months.

## 2016-09-06 NOTE — Patient Instructions (Addendum)
Check blood sugars fasting in the morning and again and bedtime and record and bring to next visit  Drink 100 ounces of water daily.  Check you blood pressure 2-3 times per week and record and bring to next.  Recuento de carbohidratos para la diabetes mellitus en los adultos Carbohydrate Counting for Diabetes Mellitus, Adult El recuento de carbohidratos es un mtodo destinado a Midwife un registro de la cantidad de carbohidratos que se ingieren. La ingesta natural de carbohidratos aumenta la cantidad de azcar (glucosa) en la sangre. El recuento de la cantidad de carbohidratos que se ingieren sirve para que el nivel de glucosa sangunea (glucemia) permanezca dentro de los lmites normales, lo que ayuda a Pharmacologist la diabetes (diabetes mellitus) bajo control. Es importante saber la cantidad de carbohidratos que se pueden ingerir en cada comida sin correr Surveyor, minerals. Esto es Government social research officer. Un especialista en alimentacin y nutricin (nutricionista certificado) puede ayudarlo a crear un plan de alimentacin y a calcular la cantidad de carbohidratos que debe ingerir en cada comida y colacin. Los siguientes alimentos incluyen carbohidratos:  Granos, como panes y cereales.  Frijoles secos y productos con soja.  Verduras con almidn, como papas, guisantes y maz.  Nils Pyle y jugos de frutas.  Leche y Dentist.  Dulces y colaciones, como pasteles, galletas, caramelos, papas fritas y refrescos. Cmo se calculan los carbohidratos? Hay dos maneras de calcular los carbohidratos de los alimentos. Puede usar cualquiera de [address] o [place] combinacin de [place]. Leer la etiqueta de informacin nutricional de los alimentos envasados  La lista de informacin nutricional est incluida en las etiquetas de casi todas las bebidas y los alimentos envasados de los [place]. Incluye lo siguiente:  El tamao de la porcin.  Informacin sobre los nutrientes de cada porcin, incluidos los  gramos (g) de carbohidratos por porcin. Para usar la informacin nutricional:  Decida cuntas porciones va a comer.  Multiplique la cantidad de porciones por el nmero de carbohidratos por porcin.  El resultado es la cantidad total de carbohidratos que comer. Conocer los tamaos de las porciones estndar de otros alimentos  Cuando coma alimentos que contienen carbohidratos que no estn envasados o no incluyen la informacin nutricional en la etiqueta, debe medir las porciones para poder calcular la cantidad de carbohidratos:  Mida los alimentos que comer con una balanza de alimentos o una taza medidora, si es necesario.  Decida cuntas porciones de Programmer, systems.  Multiplique el nmero de porciones por15. La mayora de los alimentos con alto contenido de carbohidratos contienen unos 15g de carbohidratos por porcin.  Por ejemplo, si come 8onzas (170g) de fresas, habr comido 2porciones y 30g de carbohidratos (2porciones x 15g=30g).  En el caso de las comidas que contienen mezclas de ms de un alimento, como las sopas y los guisos, debe calcular los carbohidratos de cada alimento que se incluye. La siguiente [employer] tamaos de las porciones estndar de los alimentos corrientes con alto contenido de carbohidratos. Cada una de estas porciones tiene aproximadamente 15g de carbohidratos:  pan de hamburguesa o muffin ingls.  onza (15ml) de almbar.  onza (14g) de gelatina.  1rebanada de pan.  1tortilla de seispulgadas.  3onzas (85g) de arroz o pasta cocidos.  4onzas (113g) de frijoles secos cocidos.  4onzas (113g) de verduras con almidn, como guisantes, maz o papas.  4onzas (113g) de cereal caliente.  4onzas (113g) de pur de papas o de una papa grande al horno.  4onzas (113g) de  frutas en lata o congeladas.  4onzas ( ) de jugo de frutas.  4a 6galletas.  6croquetas de pollo.  6onzas (170g) de  cereales secos sin azcar.  6onzas (170g) de yogur descremado sin ningn agregado o de yogur endulzado con edulcorante artificial.  8onzas ( ) de [place].  8onzas (170g) de frutas frescas o una fruta pequea.  24onzas (680g) de palomitas de maz. Ejemplo de recuento de carbohidratos Ejemplo de comida   3onzas (85g) de pechuga de pollo.  6onzas (170g) de arroz integral.  4onzas (113g) de maz.  8onzas ( ) de leche.  8onzas (170g) de fresas con crema batida sin azcar. Clculo de carbohidratos  1. Identifique los alimentos que contienen carbohidratos:  Arroz.  Maz.  Leche.  Jinny Sanders. 2. Calcule cuntas porciones come de cada alimento:  2 porciones de arroz.  1 porcin de maz.  1 porcin de leche.  1 porcin de fresas. 3. Multiplique cada nmero de porciones por 15g:  2 porciones de arroz x 15 g = 30 g.  1 porcin de maz x 15 g = 15 g.  1 porcin de leche x 15 g = 15 g.  1 porcin de fresas x 15 g = 15 g. 4. Sume todas las cantidades para conocer el total de gramos de carbohidratos consumidos:  30g + 15g + 15g + 15g = 75g de carbohidratos en total. Esta informacin no tiene como fin reemplazar el consejo del mdico. Asegrese de hacerle al mdico cualquier pregunta que tenga. Document Released: 08/14/2011 Document Revised: 05/09/2016 Document Reviewed: 11/03/2015 Elsevier Interactive Patient Education  2017 [employer]. Control del nivel sanguneo de glucosa en los adultos (Blood Glucose Monitoring, Adult) El control del nivel de azcar (glucosa) en la sangre lo ayuda a tener la diabetes bajo control. Tambin ayuda a que usted y el mdico controlen si el tratamiento de la diabetes es Engineer, manufacturing. El control del nivel sanguneo de glucosa implica realizar controles regulares como lo indique el mdico y Midwife registro de los resultados (registro diario). POR QU DEBO CONTROLAR EL NIVEL SANGUNEO DE GLUCOSA? Si controla su nivel  sanguneo de glucosa con regularidad, podr:  Comprender de [employer], la actividad fsica, las enfermedades y los medicamentos inciden en los niveles sanguneos de [place].  Conocer el nivel sanguneo de glucosa en cualquier momento dado. Saber rpidamente si el nivel es bajo (hipoglucemia) o alto (hiperglucemia).  Puede ser de ayuda para que usted y el mdico sepan cmo ajustar los medicamentos. CUNDO DEBO CONTROLAR EL NIVEL SANGUNEO DE GLUCOSA? Siga las indicaciones del mdico acerca de la frecuencia con la que debe controlar el nivel sanguneo de glucosa. La frecuencia puede depender de:  El tipo de diabetes que tenga.  Si su diabetes est bajo control.  Los medicamentos que toma. Si usted tiene diabetes tipo 1:  Controle su nivel sanguneo de glucosa al [facility] al da.  Tambin controle su nivel sanguneo de glucosa:  Antes de cada inyeccin de insulina.  Antes y despus de hacer ejercicio.  Entre las comidas.  Dos horas despus de una comida.  Ocasionalmente, entre las 2:00a.m. y las 3:00a.m., como se lo hayan indicado.  Antes de Education officer, environmental tareas peligrosas, Sport and exercise psychologist o usar maquinaria pesada.  A la hora de acostarse.  Es posible que Chemical engineer con ms frecuencia los niveles sanguneos de [place], [place] 6 a 10veces por da:  Si [place] una bomba de [place].  Si necesita varias inyecciones diarias.  Si su diabetes no est bien controlada.  Si est enfermo.  Si tiene antecedentes de hipoglucemia grave.  Si tiene antecedentes de no darse cuenta cundo est bajando su nivel sanguneo de glucosa (hipoglucemia asintomtica). Si usted tiene diabetes tipo 2:  Si recibe insulina u otro medicamento para la diabetes, controle el nivel sanguneo de glucosa al [employer] veces al da.  Mientras reciba tratamiento intensivo con insulina, debe medirse el nivel sanguneo de glucosa al menos 4veces al [employer]. Ocasionalmente, es posible que deba  controlarse entre las 2:00a.m. y las 3:00a.m., segn se lo indiquen.  Tambin controle su nivel sanguneo de glucosa:  Antes y despus de hacer ejercicio.  Antes de Education officer, environmental tareas peligrosas, Sport and exercise psychologist o usar maquinaria pesada.  Es posible que Chemical engineer con ms frecuencia los niveles sanguneos de glucosa si:  Es necesario ajustar la dosis de sus medicamentos.  Su diabetes no est bien controlada.  Est enfermo. QU ES UN REGISTRO DIARIO DEL NIVEL [place] DE GLUCOSA?  Un registro diario es un registro de los valores de glucosa en la [place]. Puede ayudarles a usted y a su mdico a:  Air traffic controller su nivel sanguneo de glucosa durante el transcurso del [place].  Ajustar su plan de control de la diabetes como sea necesario.  Cada vez que controle su nivel sanguneo de glucosa, anote el resultado y aquellos factores que pueden estar afectando su nivel sanguneo de glucosa, como la dieta y la actividad fsica Dietitian.  La [employer] de los medidores de glucosa guardan un registro de las lecturas realizadas con el medidor. Algunos permiten descargar sus registros en una computadora. CMO ME CONTROLO EL NIVEL SANGUNEO DE GLUCOSA? Siga los siguientes pasos para obtener lecturas precisas de su glucemia: Materiales necesarios  Medidor de Horticulturist, commercial.  Tiras reactivas para el medidor. Cada medidor tiene sus propias tiras reactivas. Debe usar las tiras reactivas que trae su medidor.  Una aguja para pincharse el dedo (lanceta). No utilice la misma lanceta en ms de una ocasin.  Un dispositivo que sujeta la lanceta (dispositivo de puncin).  Un diario o libro de anotaciones para [employer]. Procedimiento  [employer] con agua y [place].  Pnchese el costado del dedo (no la punta) con Optometrist. Use un dedo diferente cada vez.  Frote suavemente el dedo [employer] aparezca una pequea gota de [place].  Siga las instrucciones que  vienen con el medidor para Public affairs consultant tira Firefighter, Contractor la sangre sobre la tira y usar el medidor de Horticulturist, commercial.  Registre el resultado y las observaciones que desee. Zonas del cuerpo alternativas para realizar las pruebas  Algunos medidores le permiten tomar sangre para la prueba de otras zonas del cuerpo que no son el dedo (zonas alternativas).  Si cree que tiene hipoglucemia o si tiene hipoglucemia asintomtica, no utilice las zonas alternativas del cuerpo. En su lugar, use los dedos.  Es posible que las zonas alternativas no sean tan precisas como los dedos porque el flujo de sangre es ms lento en esas zonas. Esto significa que el resultado que obtiene de estas zonas puede estar retrasado y ser un poco diferente del resultado que obtendra del dedo.  Los sitios alternativos ms comunes son los siguientes:  Los antebrazos.  Los muslos.  La palma de la mano. Consejos adicionales  Siempre tenga los insumos a mano.  Todos los medidores de glucosa incluyen un nmero de telfono "directo", disponible las 24 horas, al que podr llamar si tiene preguntas o  necesita ayuda. Tambin puede consultar a su mdico.  Despus de usar algunas cajas de tiras reactivas, ajuste (calibre) el medidor de glucemia segn las instrucciones del medidor. Esta informacin no tiene Theme park manager el consejo del mdico. Asegrese de hacerle al mdico cualquier pregunta que tenga. Document Released: 05/22/2005 Document Revised: 09/13/2015 Document Reviewed: 11/01/2015 Elsevier Interactive Patient Education  2017 [employer].

## 2016-09-22 ENCOUNTER — Telehealth: Payer: Self-pay | Admitting: General Practice

## 2016-09-22 NOTE — Telephone Encounter (Signed)
[address] [place] # [id number] contacted pt and was unable to lvm

## 2016-09-22 NOTE — Telephone Encounter (Signed)
Patient called to speak with the nurse since he got a prescription from [name] metoprolol tartrate (LOPRESSOR) 25 MG tablet  And his getting nauseas ,need some advice Patient speak spanish. Please follow up

## 2016-09-25 ENCOUNTER — Other Ambulatory Visit: Payer: Self-pay | Admitting: Pharmacist

## 2016-09-25 MED ORDER — METOPROLOL TARTRATE 25 MG PO TABS: 25.0000 mg | ORAL_TABLET | Freq: Two times a day (BID) | ORAL | 0 refills | Status: DC

## 2016-09-25 MED ORDER — ATORVASTATIN CALCIUM 80 MG PO TABS: 80.0000 mg | ORAL_TABLET | Freq: Every evening | ORAL | 0 refills | Status: DC

## 2016-09-25 MED ORDER — TICAGRELOR 90 MG PO TABS: 90.0000 mg | ORAL_TABLET | Freq: Two times a day (BID) | ORAL | 0 refills | Status: DC

## 2016-09-25 MED FILL — ATORVASTATIN 80 MG TABLET: 80 | 30 days supply | Qty: 30 | Fill #0

## 2016-09-25 MED FILL — BRILINTA 90 MG TABLET: 90 | 30 days supply | Qty: 60 | Fill #0

## 2016-09-25 MED FILL — METOPROLOL TARTRATE 25 MG T: 25 | 30 days supply | Qty: 60 | Fill #0

## 2016-09-27 ENCOUNTER — Ambulatory Visit: Payer: Self-pay | Attending: Family Medicine | Admitting: Family Medicine

## 2016-09-27 VITALS — BP 106/68 | HR 69 | Temp 97.8°F | Resp 18 | Ht 67.0 in | Wt 160.8 lb

## 2016-09-27 DIAGNOSIS — Z7982 Long term (current) use of aspirin: Secondary | ICD-10-CM | POA: Insufficient documentation

## 2016-09-27 DIAGNOSIS — Z0000 Encounter for general adult medical examination without abnormal findings: Secondary | ICD-10-CM

## 2016-09-27 DIAGNOSIS — IMO0001 Reserved for inherently not codable concepts without codable children: Secondary | ICD-10-CM

## 2016-09-27 DIAGNOSIS — E1165 Type 2 diabetes mellitus with hyperglycemia: Secondary | ICD-10-CM

## 2016-09-27 DIAGNOSIS — Z23 Encounter for immunization: Secondary | ICD-10-CM | POA: Insufficient documentation

## 2016-09-27 DIAGNOSIS — I252 Old myocardial infarction: Secondary | ICD-10-CM | POA: Insufficient documentation

## 2016-09-27 DIAGNOSIS — E119 Type 2 diabetes mellitus without complications: Secondary | ICD-10-CM | POA: Insufficient documentation

## 2016-09-27 DIAGNOSIS — H547 Unspecified visual loss: Secondary | ICD-10-CM

## 2016-09-27 DIAGNOSIS — Z7902 Long term (current) use of antithrombotics/antiplatelets: Secondary | ICD-10-CM | POA: Insufficient documentation

## 2016-09-27 DIAGNOSIS — Z79899 Other long term (current) drug therapy: Secondary | ICD-10-CM | POA: Insufficient documentation

## 2016-09-27 DIAGNOSIS — I10 Essential (primary) hypertension: Secondary | ICD-10-CM | POA: Insufficient documentation

## 2016-09-27 DIAGNOSIS — I2510 Atherosclerotic heart disease of native coronary artery without angina pectoris: Secondary | ICD-10-CM | POA: Insufficient documentation

## 2016-09-27 LAB — POCT UA - MICROALBUMIN
Creatinine, POC: 50 mg/dL
Microalbumin Ur, POC: 10 mg/L

## 2016-09-27 LAB — GLUCOSE, POCT (MANUAL RESULT ENTRY): POC Glucose: 185 mg/dl — AB (ref 70–99)

## 2016-09-27 MED ORDER — METFORMIN HCL ER 500 MG PO TB24: 1000.0000 mg | ORAL_TABLET | Freq: Two times a day (BID) | ORAL | 1 refills | Status: DC

## 2016-09-27 MED ORDER — PNEUMOCOCCAL 13-VAL CONJ VACC IM SUSP
0.5000 mL | Freq: Once | INTRAMUSCULAR | Status: AC
  Administered 2016-09-27: 0.5 mL via INTRAMUSCULAR

## 2016-09-27 MED FILL — METFORMIN HCL ER 500 MG TAB: 500 | 30 days supply | Qty: 120 | Fill #0

## 2016-09-27 NOTE — Patient Instructions (Addendum)
You will be called with your labs results. Start keeping log of blood sugars and glucometer to next office visit.   Recuento de carbohidratos para la diabetes mellitus en los adultos Carbohydrate Counting for Diabetes Mellitus, Adult El recuento de carbohidratos es un mtodo destinado a Catering manager un registro de la cantidad de carbohidratos que se ingieren. La ingesta natural de carbohidratos aumenta la cantidad de azcar (glucosa) en la sangre. El recuento de la cantidad de carbohidratos que se ingieren sirve para que el nivel de glucosa sangunea (glucemia) permanezca dentro de los lmites normales, lo que ayuda a Theatre manager la diabetes (diabetes mellitus) bajo control. Es importante saber la cantidad de carbohidratos que se pueden ingerir en cada comida sin correr Engineer, manufacturing. Esto es Psychologist, forensic. Un especialista en alimentacin y nutricin (nutricionista certificado) puede ayudarlo a crear un plan de alimentacin y a calcular la cantidad de carbohidratos que debe ingerir en cada comida y colacin. Los siguientes alimentos incluyen carbohidratos:  Granos, como panes y cereales.  Frijoles secos y productos con soja.  Verduras con almidn, como papas, guisantes y maz.  Lambert Mody y jugos de frutas.  Leche y Estate agent.  Dulces y colaciones, como pasteles, galletas, caramelos, papas fritas y refrescos. Cmo se calculan los carbohidratos? Hay dos maneras de calcular los carbohidratos de los alimentos. Puede usar cualquiera de los dos mtodos o [place] combinacin de [place]. Leer la etiqueta de informacin nutricional de los alimentos envasados  La lista de informacin nutricional est incluida en las etiquetas de casi todas las bebidas y los alimentos envasados de los [place]. Incluye lo siguiente:  El tamao de la porcin.  Informacin sobre los nutrientes de cada porcin, incluidos los gramos (g) de carbohidratos por porcin. Para usar la informacin nutricional:  Decida cuntas  porciones va a comer.  Multiplique la cantidad de porciones por el nmero de carbohidratos por porcin.  El resultado es la cantidad total de carbohidratos que comer. Conocer los tamaos de las porciones estndar de otros alimentos  Cuando coma alimentos que contienen carbohidratos que no estn envasados o no incluyen la informacin nutricional en la etiqueta, debe medir las porciones para poder calcular la cantidad de carbohidratos:  Mida los alimentos que comer con una balanza de alimentos o una taza medidora, si es necesario.  Decida cuntas porciones de International aid/development worker.  Multiplique el nmero de porciones por15. La mayora de los alimentos con alto contenido de carbohidratos contienen unos 15g de carbohidratos por porcin.  Por ejemplo, si come 8onzas (170g) de fresas, habr comido 2porciones y 30g de carbohidratos (2porciones x 15g=30g).  En el caso de las comidas que contienen mezclas de ms de un alimento, como las sopas y los guisos, debe calcular los carbohidratos de cada alimento que se incluye. La siguiente [employer] tamaos de las porciones estndar de los alimentos corrientes con alto contenido de carbohidratos. Cada una de estas porciones tiene aproximadamente 15g de carbohidratos:  pan de hamburguesa o muffin ingls.  onza (92m) de almbar.  onza (14g) de gelatina.  1rebanada de pan.  1tortilla de seispulgadas.  3onzas (85g) de arroz o pasta cocidos.  4onzas (113g) de frijoles secos cocidos.  4onzas (113g) de verduras con almidn, como guisantes, maz o papas.  4onzas (113g) de cereal caliente.  4onzas (113g) de pur de papas o de una papa grande al horno.  4onzas (113g) de frutas en lata o congeladas.  4onzas (1232m de jugo de frutas.  4a 6galletas.  6croquetas de pollo.  6onzas (170g) de cereales secos sin azcar.  6onzas (170g) de yogur descremado sin ningn agregado o de yogur  endulzado con edulcorante artificial.  8onzas (246m) de lFort Peck  8onzas (170g) de frutas frescas o una fruta pequea.  24onzas (680g) de palomitas de maz. Ejemplo de recuento de carbohidratos Ejemplo de comida   3onzas (85g) de pechuga de pollo.  6onzas (170g) de arroz integral.  4onzas (113g) de maz.  8onzas (2428m de leche.  8onzas (170g) de fresas con crema batida sin azcar. Clculo de carbohidratos  1. Identifique los alimentos que contienen carbohidratos:  Arroz.  Maz.  Leche.  FrHughie Closs2. Calcule cuntas porciones come de cada alimento:  2 porciones de arroz.  1 porcin de maz.  1 [place] 1 porcin de fresas. 3. Multiplique cada nmero de porciones por 15g:  2 porciones de arroz x 15 g = 30 g.  1 porcin de maz x 15 g = 15 g.  1 porcin de leche x 15 g = 15 g.  1 porcin de fresas x 15 g = 15 g. 4. Sume todas las cantidades para conocer el total de gramos de carbohidratos consumidos:  30g + 15g + 15g + 15g = 75g de carbohidratos en total. Esta informacin no tiene como fin reemplazar el consejo del mdico. Asegrese de hacerle al mdico cualquier pregunta que tenga. Document Released: 08/14/2011 Document Revised: 05/09/2016 Document Reviewed: 11/03/2015 Elsevier Interactive Patient Education  2017 [employer] Diabetes mellitus tipo2 en los adultos, cuidados personales (Type 2 Diabetes Mellitus, Self Care, Adult) Las personas que tienen diabetes tipo2 (diabetes mellitus tipo2) deben maContractorivel de glucosa en la sangre bajo control. Es posible hacerlo a travs de lo siguiente:  Nutricin.  Actividad fsica.  Cambios en el estilo de vida.  Medicamentos o insulina, si es necesario.  El ap53e los mdicos y de otProducer, television/film/videoCMO ME CONTROLO EL NIVEL DE GLUCOSA EN LA SANGRE?  Contrlese la glucemia todos los daShieldscon la frecuencia que le hayan indicado.  Llame al mdico si el nivel de  glucosa en la sangre est por encima de las cifras ideales en 2anlisis seguidos.  Contrlese el nivel de hemoglobina A1c al meHalliburton Companyl ao. Realcese controles ms frecuentes si el mdico se lo indica. El mdico fijar los objetivos del tratamiento para usted. Generalmente, los resultados de los niveles de glucosa en la sangre deben ser los siguientes:  Antes de las comidas (preprandial): de 80 a '130mg'$ /dl (4,4 a 7,68m44m/l).  Despus de las comidas (posprandial): menos de '180mg'$ /dl (12m768ml).  Nivel de A1c: menos del 7%. QU DEBO SABER ACERCA DE LA GLUCEMIA ALTA? El nivel alto de glucosa en la sangre se denomina hiperglucemia. Conozca los signos de la hiperglucemia. Los signos pueden incluir lo siguiente:  Sentir que tiene lo siguiente:  Sed.  Apetito.  Mucho cansancio.  Necesidad de orinGarment/textile technologist mayor frecuencia que lo habitual.  Visin borrosa. QU DEBO SABER ACERCA DE LA GLUCEMIA BAJA? El nivel bajo de glucosa en la sangre se denomina hipoglucemia. Este cuadro ocurre cuando el nivel de glucosa en la sangre es igual o menor que '70mg'$ /dl (3,9mmo59m). Entre los sntomas se pueden incluir los siguientes:  Sentir que tiene lo siguiente:  Apetito.  Preocupacin o nervios (ansioso).  Sudoracin y piel hmeda.  Confusin.  Mareos.  Somnolencia.  Nuseas.  Tener lo siguiente:  Latidos cardacos rpidos (palpitaciones).  Dolor de cabezNetherlandsmbios en la visin.  Una cArdelia Memsis de movimientos  que no puede controlar (convulsiones).  Pesadillas.  Hormigueo y falta de sensibilidad (adormecimiento) alrededor de la boca, los labios o la [place].  Dificultades para hacer lo siguiente:  Hablar.  Prestar atencin (concentrarse).  Moverse (coordinacin).  Dormir.  Temblores.  Desmayos.  Molestarse con facilidad (irritabilidad). Tratamiento de la glucemia baja Para tratar la glucemia baja, ingiera un alimento o una bebida azucarada de inmediato. Si  puede pensar con claridad y tragar de manera segura, siga la regla 15/15, que consiste en lo siguiente:  Consumir 15gramos de un hidrato de carbono de accin rpida. Algunos hidratos de carbono de accin rpida son los siguientes:  1pomo de glucosa en gel.  3comprimidos de azcar (comprimidos de glucosa).  6 a 8unidades de caramelos duros.  4onzas (143m) de jugo de frutas.  4onzas (1284m de gaseosa comn (no diettica).  Contrlese la glucemia 1549mtos despus de ingerir el hidrato de carbono.  Si el nivel de glucosa en la sangre todava es igual o menor que '70mg'$ /dl (3,9mm57ml), ingiera nuevamente 15gramos de un hidrato de carbono.  Si el nivel de glucosa en la sangre no supera los '70mg'$ /dl (3,9mmo46m) despus de 3intentos, solicite ayuda de inmediato.  Ingiera una comida o una colacin en el transcurso de 1hora despus de que la glucemia se haya normalizado. Tratamiento de la glucemia muy baja Si el nivel de glucosa en la sangre es igual o menor que '54mg'$ /dl (3mmol63m, significa que est muy bajo (hipoglucemia grave). Esto es una emEngineer, maintenance (IT)spere hasta que los sntomas desaparezcan. Solicite atencin mdica de inmediato. Comunquese con el servicio de emergencias de su localidad (911 en los [place]). No conduzca por sus propios medios hasta Principal Financialu nivel de glucosa en la sangre muy bajo y no puede ingerir ningn alimento ni bebida, tal vez deba aplicarse una inyeccin de glucagn. Un familiar o un amigo deben aprender a controlarle la glucemia y a aplicarle una inyeccin de glucagn. Pregntele al mdico si debe tener un kit de inyecciones de glucagn en su casa. QU OTRAS COSAS SON IMPORTANTES PARA CONTROLAR LA DIABETES? Medicamentos Siga estas indicaciones respecto de la insulina y los medicamentos para la diabetes:  Tmelos como se lo haya indicado el mdico.  Ajstelos como se lo haya indicado el mdico.  No se quede sin  medicamentos. La diabetes puede aumentar el riesgo de tener otras enfermedades a largo Barrister's clerks incluyen las cardiopatas coronarias o enfermedades renales. El mdico puede recetar medicamentos para evitar los problemas causados por la diabetes. Alimento  Opte por opciones de alimentos saludables. Estos incluyen los siguientes:  Pollo, pescado, claras de huevos y frijoles.  Avena, salvado, trigo burgol, arroz integral, quinua y mijo.  FrutasLambert Modyduras frescas.  Productos lcteos descremados.  Frutos secos, aguacate, aceite de oliva Fern Forestite de canola.  Arme un plan de alimentacin con un especialista (nutricionista).  Siga las indicaciones del mdico respecto de lo que no puede comer o beber.  Beba suficiente lquido para mantener el pis (orina) claro o de color amarillo plido.  Ingiera colaciones saludables entre comidas saludables.  Lleve un registro de los hidratos de carbono que consume. Lea las etiquetas de los alimentos. Conozca el tamao de las porciones de los alimentos.  Siga el plan para los das de enfermedad cuando no pueda comer o beber normalmente. Arme este plan con el mdico, de modo que est listo para usarlo. Actividad  Practique ejercicios al menos 3veces por semana.  No deje pasar ms de 2das sin hacer  actividad fsica.  Hable con el mdico antes de comenzar un ejercicio nuevo. Es posible que el mdico deba hacer ajustes en la [place], los medicamentos o los alimentos. Estilo de vida  No use productos que contengan tabaco. Estos incluyen cigarrillos, tabaco de Higher education careers adviser y cigarrillos electrnicos. Si necesita ayuda para dejar de fumar, consulte al mdico.  Pregntele a su mdico qu cantidad de alcohol puede tomar.  Aprenda a sobrellevar el estrs. Si necesita ayuda para lograrlo, consulte al [employer]. Cuidado del cuerpo  Mantngase al da con las vacunas.  Hgase controlar los ojos y los pies por un mdico con la frecuencia que le hayan  indicado.  Revsese la piel y [employer]. Examine si hay cortes, moretones, enrojecimiento, ampollas o llagas.  Cepllese los dientes y [place].  Use el hilo dental al menos una vez al da.  Vaya al dentista al menos una vez cada 82mses.  Mantenga un peso saludable. Instrucciones generales  TDelphide venta libre y los recetados solamente como se lo haya indicado el mdico.  Comparta su plan de atencin de la diabetes con estas personas:  Compaeros de trabajo o de la escuela.  Aquellas con las que cDor[place]  Hgase anlisis de orina para dProduct managerpresencia de cetonas:  Cuando est enfermo.  Como se lo haya indicado el mdico.  Lleve consigo una tarjeta, o use un brazalete o una medalla que indiquen que es diabtico.  Pregntele al mdico lo siguiente:  Debo reunirme con uRadio broadcast assistantpara el cuidado de la diabetes?  Dnde puedo encontrar un grupo de apoyo para personas diabticas?  Concurra a todas las visitas de control como se lo haya indicado el mdico. Esto es importante. DNDE ENCONTRAR MS INFORMACIN: Para obtener ms informacin sobre la diabetes, visite los siguientes sitios:  Asociacin Americana de la Diabetes (American Diabetes Association): www.diabetes.org  Asociacin Norteamericana de Instructores para el Cuidado de la Diabetes (American Association of Diabetes Educators): www.diabeteseducator.org/patient-resources Esta informacin no tiene cMarine scientistel consejo del mdico. Asegrese de hacerle al mdico cualquier pregunta que tenga. Document Released: 09/13/2015 Document Revised: 09/13/2015 Document Reviewed: 06/25/2015 Elsevier Interactive Patient Education  2017 [employer]

## 2016-09-27 NOTE — Progress Notes (Signed)
Subjective:  Patient ID: Scott Mcclain, male    DOB: 12-13-1975  Age: 41 y.o. MRN: [id number]  CC: Diabetes    HPI Ilario Dhaliwal presents for   HTN/CAD: Past medical history of DM and HTN. Recent history STEMI at work and was taken to ED on 08/30/2016. Cardiac cath done with PCI inter Denies any chest pain, shortness of breath, swelling of the bilateral lower extremities, or claudication symptoms. He denies any shortness of breath or chest pain with activity. He reports adherence with current medications.  DM: Reports 1 month of nausea with diarrhea with metformin use. He reports taking only 500 mg twice a day instead of 1000 mg prescribed. Takes  blood sugars twice a day. Morning blood glucose 100 to 180s. Evening blood glucose 150s to 180s. Reports decreased visual acuity.Denies seeing an ophthalmologist within the last year. Denies all diabetes associated symptoms.     Outpatient Medications Prior to Visit  Medication Sig Dispense Refill  . aspirin EC 81 MG EC tablet Take 1 tablet (81 mg total) by mouth daily. 90 tablet 3  . atorvastatin (LIPITOR) 80 MG tablet Take 1 tablet (80 mg total) by mouth every evening. 30 tablet 0  . Blood Glucose Monitoring Suppl (TRUE METRIX METER) w/Device KIT Check blood sugar fasting and at bedtime 1 kit 0  . glucose blood (TRUE METRIX BLOOD GLUCOSE TEST) test strip Use as instructed 100 each 12  . metoprolol tartrate (LOPRESSOR) 25 MG tablet Take 1 tablet (25 mg total) by mouth 2 (two) times daily. 60 tablet 0  . nitroGLYCERIN (NITROSTAT) 0.4 MG SL tablet Place 1 tablet (0.4 mg total) under the tongue every 5 (five) minutes as needed for chest pain (up to 3 doses). 25 tablet 3  . ticagrelor (BRILINTA) 90 MG TABS tablet Take 1 tablet (90 mg total) by mouth 2 (two) times daily. 60 tablet 0  . TRUEPLUS LANCETS 28G MISC Check blood sugars fasting and at bedtime and record 100 each 1  . metFORMIN (GLUCOPHAGE) 1000 MG tablet Take 1 tablet (1,000  mg total) by mouth 2 (two) times daily with a meal. 180 tablet 3   No facility-administered medications prior to visit.     ROS Review of Systems  Constitutional: Negative.   Eyes: Positive for visual disturbance.  Respiratory: Negative.   Cardiovascular: Negative.   Gastrointestinal: Negative.   Skin: Negative.     Objective:  BP 106/68 (BP Location: Left Arm, Patient Position: Sitting, Cuff Size: Normal)   Pulse 69   Temp 97.8 F (36.6 C) (Oral)   Resp 18   Ht '5\' 7"'$  (1.702 m)   Wt 160 lb 12.8 oz (72.9 kg)   SpO2 98%   BMI 25.18 kg/m   BP/Weight 09/27/2016 08/04/5496 07/11/4156  Systolic BP 309 407 680  Diastolic BP 68 67 76  Wt. (Lbs) 160.8 160.2 161.4  BMI 25.18 26.66 26.86     Physical Exam  Eyes: Conjunctivae are normal. Pupils are equal, round, and reactive to light.  Neck: Normal range of motion. Neck supple. No JVD present.  Cardiovascular: Normal rate, regular rhythm, normal heart sounds and intact distal pulses.   Pulmonary/Chest: Effort normal and breath sounds normal.  Abdominal: Soft. Bowel sounds are normal.  Skin: Skin is warm and dry.  Nursing note and vitals reviewed.   Diabetic Foot Exam - Simple   Simple Foot Form Diabetic Foot exam was performed with the following findings:  Yes 09/27/2016 10:57 AM  Visual Inspection No  deformities, no ulcerations, no other skin breakdown bilaterally:  Yes Sensation Testing Intact to touch and monofilament testing bilaterally:  Yes Pulse Check Posterior Tibialis and Dorsalis pulse intact bilaterally:  Yes Comments     Assessment & Plan:   Problem List Items Addressed This Visit      Cardiovascular and Mediastinum   CAD in native artery   Essential hypertension    -BP well controlled on current medications.    Endocrine   Uncontrolled diabetes mellitus ([place]) - Primary   Change formulation of metformin to lessen GI side effects.   Relevant Medications   metFORMIN (GLUCOPHAGE-XR) 500 MG 24 hr tablet    Other Relevant Orders   Glucose (CBG) (Completed)   POCT UA - Microalbumin (Completed)    Other Visit Diagnoses    Decreased visual acuity       Vision acuity screen   Relevant Orders   Ambulatory referral to Ophthalmology   Health care maintenance       Relevant Medications   pneumococcal 13-valent conjugate vaccine (PREVNAR 13) injection 0.5 mL (Completed)      Meds ordered this encounter  Medications  . metFORMIN (GLUCOPHAGE-XR) 500 MG 24 hr tablet    Sig: Take 2 tablets (1,000 mg total) by mouth 2 (two) times daily with a meal.    Dispense:  180 tablet    Refill:  1    Order Specific Question:   Supervising Provider    Answer:   Tresa Garter [id number]  . pneumococcal 13-valent conjugate vaccine (PREVNAR 13) injection 0.5 mL    Follow-up: Return in about 1 month (around 10/27/2016) for DM/ CAD/HTN.   Alfonse Spruce FNP

## 2016-09-27 NOTE — Progress Notes (Signed)
Patient is here for f/ up  Patient denies pain for today  Patient has taking his med for today  Patient has eaten for today

## 2016-10-02 ENCOUNTER — Ambulatory Visit: Payer: Self-pay | Attending: Family Medicine

## 2016-10-13 MED FILL — TRUE METRIX TEST STRIP: 25 days supply | Qty: 100 | Fill #1

## 2016-10-13 MED FILL — TRUEplus LANCETS 28G MISC: 25 days supply | Qty: 100 | Fill #1

## 2016-10-23 ENCOUNTER — Other Ambulatory Visit: Payer: Self-pay | Admitting: Internal Medicine

## 2016-10-24 MED FILL — ATORVASTATIN 80 MG TABLET: 80 | 30 days supply | Qty: 30 | Fill #0

## 2016-10-26 ENCOUNTER — Ambulatory Visit: Payer: Self-pay | Attending: Family Medicine | Admitting: Family Medicine

## 2016-10-26 VITALS — BP 99/62 | HR 78 | Temp 98.4°F | Resp 18 | Ht 69.0 in | Wt 156.8 lb

## 2016-10-26 DIAGNOSIS — E0800 Diabetes mellitus due to underlying condition with hyperosmolarity without nonketotic hyperglycemic-hyperosmolar coma (NKHHC): Secondary | ICD-10-CM

## 2016-10-26 DIAGNOSIS — Z79899 Other long term (current) drug therapy: Secondary | ICD-10-CM | POA: Insufficient documentation

## 2016-10-26 DIAGNOSIS — Z794 Long term (current) use of insulin: Secondary | ICD-10-CM | POA: Insufficient documentation

## 2016-10-26 DIAGNOSIS — Z7982 Long term (current) use of aspirin: Secondary | ICD-10-CM | POA: Insufficient documentation

## 2016-10-26 DIAGNOSIS — E1165 Type 2 diabetes mellitus with hyperglycemia: Secondary | ICD-10-CM | POA: Insufficient documentation

## 2016-10-26 LAB — GLUCOSE, POCT (MANUAL RESULT ENTRY)
POC GLUCOSE: 202 mg/dL — AB (ref 70–99)
POC Glucose: 262 mg/dl — AB (ref 70–99)

## 2016-10-26 MED ORDER — INSULIN ASPART 100 UNIT/ML ~~LOC~~ SOLN: 10.0000 [IU] | Freq: Once | SUBCUTANEOUS | Status: DC

## 2016-10-26 MED ORDER — INSULIN SYRINGES (DISPOSABLE) U-100 1 ML MISC: 1.0000 | Freq: Once | 11 refills | Status: AC

## 2016-10-26 MED ORDER — INSULIN DETEMIR 100 UNIT/ML ~~LOC~~ SOLN: 10.0000 [IU] | Freq: Every day | SUBCUTANEOUS | 11 refills | Status: DC

## 2016-10-26 MED FILL — !LEVEMIR 100 UNITS/ML VIAL: 100/ML | 42 days supply | Qty: 10 | Fill #0

## 2016-10-26 MED FILL — TRUEPLUS SYR 0.5ML 31GX5/16: 31G X 5/16" | 25 days supply | Qty: 100 | Fill #0

## 2016-10-26 NOTE — Progress Notes (Signed)
Patient is here for f/up  Patient has taking his medication for today   Patient has eaten for today   Patient denies pain for today

## 2016-10-26 NOTE — Patient Instructions (Addendum)
Check blood glucose twice a day, morning and evening. Bring glucometer and blood glucose log to next visit. Levemir insulin inject 10 units (0.1 mL) at bedtime.   Cmo y dnde aplicar inyecciones de insulina por va subcutnea - adultos (How and Where to Give Subcutaneous Insulin Injections, Adult) Las personas con diabetes de tipo 1 deben administrarse insulina debido a que sus cuerpos no la producen. Las personas con diabetes tipo 2 pueden requerir insulina. Existen muchos tipos diferentes de [place], as como tambin otros medicamentos inyectables para la diabetes, que se deben Energy manager la capa de tejido graso que se encuentra debajo de la piel. El tipo de insulina o el medicamento inyectable para la diabetes que tome puede determinar cuntas inyecciones deber administrarse y cundo deben aplicarse. ELEGIR UNA ZONA PARA LA INYECCIN: La absorcin de insulina vara de un sitio a otro. Al igual que con cualquier medicamento inyectable, se recomienda inyectar la insulina dentro de la misma regin del cuerpo. Sin embargo, la insulina no debe inyectarse en la misma zona cada vez que se administre. Rotar las zonas para las Investment banker, corporate la inflamacin o la degradacin de los tejidos. Hay cuatro regiones principales que pueden utilizarse para las inyecciones. Las regiones incluyen:  Abdomen (regin de preferencia, especialmente para medicamentos inyectables para la diabetes diferentes de la insulina).  La parte anterior y superior externa de los muslos.  La parte posterior superior del brazo  Los glteos UTILIZACIN DE LA [place] AMPOLLA Administrar insulina: nica dosis 1. Lave sus manos con agua y [place]. 2. Haga rodar suavemente el frasco de insulina (ampolla) entre sus manos para [place]. No agite la ampolla. 3. Limpie el tapn de goma de la ampolla con un hisopo con alcohol. Asegrese de eliminar la parte plstica superior de las ampollas nuevas. 4. Retire la cubierta plstica  de la aguja que est en la [place]. No deje que la aguja toque nada. 5. Tire del mbolo para extraer el aire de la [place]. Debe tener la misma cantidad de aire que de dosis de [place]. 6. Inserte la aguja a travs de la tapa de goma de la ampolla. No d vuelta la ampolla. 7. Empuje todo el mbolo para pasar el aire a la ampolla. 8. Deje la aguja en la ampolla. Luego, d vuelta la ampolla y la [place]. 9. Tire lentamente del mbolo, para que ingrese la cantidad de insulina que necesita a Research scientist (life sciences). 10. Observe si quedaron burbujas de [employer]. Podr necesitar empujar el mbolo hacia arriba y [place] abajo 2 o 3 veces para eliminar las burbujas de aire de la [place]. 11. Tire nuevamente del mbolo para conseguir la dosis correcta. 12. Quite la aguja de la ampolla. 13. Utilice una toallita impregnada en alcohol para limpiar la zona en la que se inyectar. 14. Inyctela, aproximadamente, 1 pulgada (2,5 cm) por debajo de la superficie de la piel, y [place]. 15. Coloque la aguja derecha en la piel (en un ngulo de 90 grados). Inserte la aguja en toda su extensin (hasta la unin con la [place]). En adultos de talla pequea y que tienen poca grasa, deber inyectarla en un ngulo de 45 grados. 16. Cuando la aguja est insertada, puede soltar la piel. 17. Empuje el mbolo hasta el final para Community education officer. 18. Para extraer, tire la aguja en forma recta. 19. Presione la toallita embebida en alcohol sobre el sitio en que aplic la inyeccin. Sostngalo all por algunos segundos. No frote la zona. 20. No  vuelva a colocar la cubierta plstica en la aguja. Administrar insulina: mezcla de 2 tipos de insulina 1. Lave sus manos con agua y Belarusjabn. 2. Haga rodar suavemente el frasco (ampolla) de la insulina "turbia" entre sus manos o grelo hacia abajo para [place] Grovemezclarla. 3. Limpie la parte superior de la ampolla con un hisopo con alcohol. Asegrese de eliminar la tapa plstica superior de las  ampollas nuevas. 4. Asegrese de que entre a la jeringa la misma cantidad de aire como de Guaminsulina "turbia" que necesita. 5. Coloque la aguja de la Bay Shorejeringa en el envase de insulina "turbia" e inyecte aire. Asegrese de que la ampolla est [place] arriba. 6. Quite la aguja de la ampolla de Guaminsulina "turbia". 7. Tire de la Ni[place]jeringa para que entre tanto aire como la cantidad de insulina "cristalina" que necesita. 8. Coloque la aguja de la Pacific Mutualjeringa en el envase de insulina "cristalina" e inyecte aire. 9. Deje la aguja en la ampolla de insulina "cristalina" y luego pngala boca abajo. 10. Tire lentamente del mbolo para que la cantidad de insulina "cristalina" deseada ingrese a la Niuejeringa. 11. Observe si quedaron burbujas de E. I. du Pontaire en la jeringa. Podr necesitar empujar el mbolo hacia arriba y [place] abajo 2 o 3 veces para eliminar las burbujas de aire de la [place]landjeringa. 12. Quite la aguja de la ampolla de insulina "cristalina". 13. Coloque la aguja en la ampolla de insulina "turbia". No inyecte la insulina "cristalina" en el frasco de la "turbia". 14. Coloque la ampolla de la insulina "turbia" Phoebe Sharpshacia abajo y tire del mbolo hasta que quede la misma cantidad total de insulina "cristalina" e insulina "turbia". 15. Quite la aguja de la ampolla de Guaminsulina "turbia". 16. Utilice una toallita impregnada en alcohol para limpiar la zona en la que se inyectar. 17. Coloque la aguja derecha en la piel (en un ngulo de 90 grados). Inserte la aguja en toda su extensin (hasta la unin con la [place]jeringa). En adultos de talla pequea y que tienen poca grasa, deber inyectarla en un ngulo de 45 grados. 18. Cuando la aguja est insertada, puede soltar la piel. 19. Empuje el mbolo hasta el final para Community education officerinyectar el medicamento. 20. Para extraer, tire la aguja en forma recta. 21. Presione la toallita embebida en alcohol sobre el sitio en que aplic la inyeccin. Sostngalo all por algunos segundos. No frote la zona. 22. No vuelva a  colocar la cubierta plstica en la aguja. UTILIZACIN DE LAPICERAS DE INSULINA 1. Lave sus manos con agua y [place]jabn. 2. Si est utilizando insulina "turbia" haga rodar la lapicera entre sus manos varias veces o grela de arriba Rem[place]hacia abajo. 3. Retire la tapa de la lapicera de insulina. 4. Limpie el tapn de goma del cartucho con una toallita impregnada en alcohol. 5. Quite la etiqueta protectora de la aguja desechable. 6. Enrosque la aguja en la lapicera. 7. Retire la cubierta plstica externa protectora de la aguja. 8. Retire la cubierta plstica interna protectora de la aguja. 9. Prepare la lapicera de insulina colocando el botn (dial) a 2 unidades. Sostenga la lapicera apuntando hacia arriba y empuje el dial hasta que aparezca una gota de insulina en la punta de la aguja. Si esto no ocurre, Paediatric nurserepita este paso. 10. Coloque en el dial el nmero de unidades de insulina que Greenvilleinyectar. 11. Utilice una toallita impregnada en alcohol para limpiar la zona en la que se inyectar. 12. Inyctela, aproximadamente, 1 pulgada (2,5 cm) por debajo de la superficie de la piel, y Geronimomantngala. 13.  Coloque la aguja derecha en la piel (en un ngulo de 90 grados). 14. Empuje el botn (dial) hacia abajo para que la insulina ingrese en el tejido [place]. 15. Cuente hasta 10 lentamente. Luego quite la [place] del tejido Journalist, newspaper. 16. Coloque la cubierta plstica externa en la aguja y desenrsquela. COMO DESECHAR LOS ELEMENTOS  Deseche las jeringas usadas en un recipiente especial para objetos cortopunzantes. Siga las indicaciones de las normas de la zona en que usted vive.  Las [employer] y las lapiceras desechables pueden arrojarse en un cesto de basura comn. Esta informacin no tiene Theme park manager el consejo del mdico. Asegrese de hacerle al mdico cualquier pregunta que tenga. Document Released: 05/22/2005 Document Revised: 09/13/2015 Document Reviewed: 10/29/2012 Elsevier Interactive Patient Education  2017  Elsevier Inc. Insulin Detemir injection Scott Mcclain es este medicamento? La INSULINA DETEMIR es una insulina de origen [place]. Este medicamento disminuye la cantidad de Bank[place]. Es una insulina de accin prolongada que normalmente se administra una o dos veces al da. Este medicamento puede ser utilizado para otros usos; si tiene alguna pregunta consulte con su proveedor de atencin mdica o con su farmacutico. MARCAS COMUNES: Levemir, Levemir FlexTouch Qu le debo informar a mi profesional de la salud antes de tomar este medicamento? Necesita saber si usted presenta alguno de los siguientes problemas o situaciones: -episodios de hipoglucemia -enfermedad renal -enfermedad heptica -una reaccin alrgica o inusual a la insulina, al metacresol, a otros medicamentos, alimentos, colorantes o conservantes -si est embarazada o buscando quedar embarazada -si est amamantando a un beb Cmo debo utilizar este medicamento? Este medicamento se administra mediante una inyeccin por va subcutnea. Tome este medicamento a la(s) misma(s) hora(s) Management consultant. selo exactamente como se le indique. Esta insulina nunca debe mezclarse en la misma jeringa con otras insulinas antes de la inyeccin. No agite vigorosamente la insulina antes de usarla. Le ensearn a ajustar la dosis para actividades y [place]. No use ms insulina de la recetada. No use con mayor o menor frecuencia de la recetada. Siempre revise la apariencia de la insulina antes de usarla. Este medicamento debe ser transparente y sin color, como el [place]. No lo use si est turbio, espeso, con color o tiene partculas slidas. Es importante que deseche las agujas y las jeringas usadas en un recipiente resistente a los pinchazos. No los deseche en la basura. Si no tiene un recipiente resistente a los pinchazos, consulte a Film/video editor o proveedor de atencin de la salud para obtenerlo. Hable con su pediatra para informarse acerca del uso de  este medicamento en nios. Aunque este medicamento se puede recetar a nios tan pequeos como de 2 aos de edad en casos selectos, existen precauciones que deben tomarse. Sobredosis: Pngase en contacto inmediatamente con un centro toxicolgico o una sala de urgencia si usted cree que haya tomado demasiado medicamento. ATENCIN: [employer] es solo para usted. No comparta este medicamento con nadie. Qu sucede si me olvido de una dosis? Es importante no olvidar una dosis. Su profesional de la salud o su mdico debe hablar con usted acerca de algn plan para dosis olvidadas. Si olvida una dosis, siga su plan. No use dosis dobles. Qu puede interactuar con este medicamento? -otros medicamentos para la diabetes Muchos medicamentos pueden aumentar o reducir Air cabin crew de Banker, tales como: -bebidas alcohlicas -aspirina y medicamentos tipo aspirina -cloranfenicol -cromo -diurticos -hormonas femeninas, como estrgenos, progestinas o pldoras anticonceptivas -medicamentos para el corazn -isoniazida -IMAOs, tales como [place], [place],  [place], [place] y [place] -hormonas masculinas o esteroides anablicos -medicamentos para bajar de peso -medicamentos para alergias, asma, resfros o tos -medicamentos para problemas mentales -niacina -AINE, medicamentos para el dolor y la inflamacin, tales como ibuprofeno o naproxeno -pentamidina -fenitona -probenecid -antibiticos quinolnicos, tales como ciprofloxacina, levofloxacina, ofloxacina -algunos suplementos diteticos a base de hierbas -medicamentos esteroideos como la prednisona o la cortisona -medicamentos tiroideos Algunos medicamentos pueden disimular los sntomas de advertencia de bajo nivel de Banker. Tendr que controlar atentamente su nivel de azcar en la sangre si est tomando algunos de stos medicamentos, tales como: -beta-bloqueantes, tales como atenolol, metoprolol,  propranolol -clonidina -guanetidina -reserpina Puede ser que esta lista no menciona todas las posibles interacciones. Informe a su profesional de [employer] de [employer] productos a base de hierbas, medicamentos de [place] o suplementos nutritivos que est tomando. Si usted fuma, consume bebidas alcohlicas o si utiliza drogas ilegales, indqueselo tambin a su profesional de [employer]. Algunas sustancias pueden interactuar con su medicamento. A qu debo estar atento al usar PPL Corporation? Visite a su mdico o a su profesional de la salud para chequear su evolucin peridicamente. Un examen llamado HbA1C (A1C) ser monitoreado. Es un simple examen de [place]. Mide su control de azcar en la sangre durante los ltimos 2 a 3 meses. Usted recibir Medtronic cada 3 a 6 meses. Aprenda cmo controlar el nivel de azcar en la sangre. Aprenda a reconocer los sntomas de bajo y alto nivel de azcar en la sangre y cmo tratarlos. Siempre lleve consigo una fuente rpida de azcar por si acaso experimenta sntomas de bajo nivel de azcar en la sangre. Ejemplos incluyen caramelos duros o tabletas de glucosa. Asegrese de que los miembros de su familia sepan que se puede ahogar si come o bebe mientras tiene sntomas graves de bajo nivel de azcar en la sangre, tales como convulsiones o prdida del conocimiento. Deben obtener ayuda mdica inmediatamente. Informe a su mdico o a su profesional de la salud si tiene alto nivel de International aid/development worker. Tal vez sea necesario cambiar la dosis de su medicamento. Si est enfermo o haciendo mucho ms ejercicio que el habitual, puede ser necesario cambiar la dosis de su medicamento. No se salte comidas. Pregunte a su mdico o a su profesional de la salud si debe evitar el consumo de alcohol. Muchos productos de venta libre para tos y resfros contienen azcar y alcohol. Estos pueden Biochemist, clinical de azcar en la sangre. Asegrese que tiene la [place] correcta para el tipo de insulina  que est utilizando. Trate de no cambiar de [place] o tipo de [place] o [place] a menos que as lo indica su mdico o su profesional de Radiographer, therapeutic. El [place] de [place] o tipos puede causar un alto o bajo nivel peligroso de Banker. Siempre lleve a mano un suministro adicional de [place], [place] y [place]. Utilice la jeringa sola una vez. Deseche la [place] y [place] en un envase cerrado para prevenir pinchazos accidentales de agujas. Nunca debe compartir las plumas o los cartuchos de [place]. Aun si cambia la aguja, compartiendo puede resultar en la propagacin de virus, tales como hepatitis o VIH. Use una pulsera o cadena de identificacin mdica. Lleve consigo una tarjeta de identificacin con informacin sobre su enfermedad y Engineer, manufacturing systems de sus medicamentos y los horarios de las dosis. Qu efectos secundarios puedo tener al [employer] este medicamento? Efectos secundarios que debe informar a su mdico o a Producer, television/film/video de la  salud tan pronto como sea posible: Therapist, art, como erupcin cutnea, picazn o urticarias, e hinchazn de la cara, los labios o la lengua problemas respiratorios signos y sntomas de niveles elevados de azcar en la sangre, tales como [place], boca seca, piel seca, aliento frutal, nuseas, dolor de [place], aumento del apetito o la sed, aumento de la frecuencia urinaria signos y sntomas de niveles bajos de International aid/development worker en la sangre, tales como ansiedad, confusin, [place], aumento del apetito, debilidad o cansancio inusuales, sudoracin, temblores, fro, irritabilidad, dolor de cabeza, visin borrosa, ritmo cardiaco rpido y prdida del conocimiento Efectos secundarios que generalmente no requieren atencin mdica (infrmelos a su mdico o a su profesional de la salud si persisten o si son molestos): aumento o disminucin del tejido adiposo debajo de la piel debido al uso excesivo de un lugar de inyeccin en particular picazn, ardor, hinchazn o erupcin en el lugar de la  inyeccin Puede ser que esta lista no menciona todos los posibles efectos secundarios. Comunquese a su mdico por asesoramiento mdico [employer]lett[employer]. Usted puede informar los efectos secundarios a la FDA por telfono al 1-800-FDA-1088. Dnde debo guardar mi medicina? Mantngala fuera del alcance de los nios. Guarde los Flextouch Pens en el refrigerador a una temperatura de [place] 2 y 8 grados C (36 y 87 grados F). No la congele o utilice si la insulina ha [place]. Una vez abierta las plumas deben ser mantenidas a una temperatura [place], menos de 30 grados C (86 grados F). Una vez abierta, no guarde Airline pilot. Una vez abierta, la insulina se puede utilizar por [address] Despus de [address] deseche el Flextouch Pen. Guarde los frascos de insulina sin abrir en el refrigerador a una temperatura de [place] 2 y 8 grados C (36 y 84 grados F). No la congele o utilice si la insulina ha [place]. Los frascos abiertos (frascos que est utilizando actualmente) pueden guardarse en el refrigerador, Pharmacist, community. Si refrigeracin no es posible, el frasco abierto se puede guardar a Marketing executive ambiente sin refrigeracin, menos de 30 grados C (86 grados F) hasta [address] Despus de [address] deseche el frasco de [place]. El Pharmacologist su insulina a Marketing executive ambiente disminuye la cantidad de dolor que experimenta durante la inyeccin. Proteger de la luz y Market researcher. Deseche todo el medicamento que no haya utilizado, despus de la fecha de vencimiento o despus que haya pasado el tiempo especificado para guardar a Publishing rights manager. ATENCIN: Este folleto es un resumen. Puede ser que no cubra toda la posible informacin. Si usted tiene preguntas acerca de esta medicina, consulte con su mdico, su farmacutico o su profesional de Radiographer, therapeutic.  2018 Elsevier/Gold Standard (2016-06-22 00:00:00)

## 2016-10-26 NOTE — Progress Notes (Signed)
Subjective:  Patient ID: Scott Mcclain, male    DOB: Mar 26, 1976  Age: 41 y.o. MRN: [id number]  CC: Diabetes   HPI Scott Mcclain presents for diabetes. Patient presents for follow up of diabetes. Symptoms: hyperglycemia. Symptoms have been basically asymptomatic. Patient denies foot ulcerations, nausea, paresthesia of the feet, visual disturbances and vomitting.  Evaluation to date has been included: fasting blood sugar, fasting lipid panel, hemoglobin A1C and microalbuminuria.  Home sugars: patient does not check blood sugars everyday. He reports am blood sugars range 140-150's, pm blood sugars range 130-180. CBG in office today 262. Treatment to date: Continued metformin which has been somewhat effective.   Outpatient Medications Prior to Visit  Medication Sig Dispense Refill  . aspirin EC 81 MG EC tablet Take 1 tablet (81 mg total) by mouth daily. 90 tablet 3  . atorvastatin (LIPITOR) 80 MG tablet TAKE 1 TABLET BY MOUTH EVERY EVENING. 30 tablet 0  . Blood Glucose Monitoring Suppl (TRUE METRIX METER) w/Device KIT Check blood sugar fasting and at bedtime 1 kit 0  . glucose blood (TRUE METRIX BLOOD GLUCOSE TEST) test strip Use as instructed 100 each 12  . metFORMIN (GLUCOPHAGE-XR) 500 MG 24 hr tablet Take 2 tablets (1,000 mg total) by mouth 2 (two) times daily with a meal. 180 tablet 1  . metoprolol tartrate (LOPRESSOR) 25 MG tablet Take 1 tablet (25 mg total) by mouth 2 (two) times daily. 60 tablet 0  . nitroGLYCERIN (NITROSTAT) 0.4 MG SL tablet Place 1 tablet (0.4 mg total) under the tongue every 5 (five) minutes as needed for chest pain (up to 3 doses). 25 tablet 3  . ticagrelor (BRILINTA) 90 MG TABS tablet Take 1 tablet (90 mg total) by mouth 2 (two) times daily. 60 tablet 0  . TRUEPLUS LANCETS 28G MISC Check blood sugars fasting and at bedtime and record 100 each 1   No facility-administered medications prior to visit.     ROS Review of Systems  Constitutional:  Negative.   Eyes: Negative.   Respiratory: Negative.   Cardiovascular: Negative.   Gastrointestinal: Negative.   Skin: Negative.   Neurological: Negative.   Psychiatric/Behavioral: Negative.    Objective:  BP 99/62 (BP Location: Left Arm, Patient Position: Sitting, Cuff Size: Normal)   Pulse 78   Temp 98.4 F (36.9 C) (Oral)   Resp 18   Ht _0  (1.753 m)   Wt 156 lb 12.8 oz (71.1 kg)   BMI 23.16 kg/m   BP/Weight 10/26/2016 9/52/8413 07/09/4008  Systolic BP 99 272 536  Diastolic BP 62 68 67  Wt. (Lbs) 156.8 160.8 160.2  BMI 23.16 25.18 26.66   Physical Exam  Constitutional: He appears well-developed and well-nourished.  Eyes: Conjunctivae are normal. Pupils are equal, round, and reactive to light.  Neck: No JVD present.  Cardiovascular: Normal rate, regular rhythm, normal heart sounds and intact distal pulses.   Pulmonary/Chest: Effort normal and breath sounds normal.  Abdominal: Soft. Bowel sounds are normal.  Skin: Skin is warm and dry.  Psychiatric: He has a normal mood and affect.  Nursing note and vitals reviewed.  Assessment & Plan:   Problem List Items Addressed This Visit    None    Visit Diagnoses    Type 2 diabetes mellitus with hyperglycemia, with long-term current use of insulin ([place])    -  Primary   Blood sugars not well controlled with metformin alone. Last Hgba1c elevated recommend adding   basal insulin to current regimen.  Recommend checking CBG's BID. Bring log of CBG's and glucometer to next office visit.    Follow up in 1 month.   Relevant Medications   insulin aspart (novoLOG) injection 10 Units   Insulin Syringes, Disposable, U-100 1 ML MISC   insulin detemir (LEVEMIR) 100 UNIT/ML injection   Other Relevant Orders   Glucose (CBG) (Completed)   Glucose (CBG) (Completed)      Meds ordered this encounter  Medications  . insulin aspart (novoLOG) injection 10 Units  . Insulin Syringes, Disposable, U-100 1 ML MISC    Sig: 1 kit by Does not  apply route once.    Dispense:  100 each    Refill:  11    Order Specific Question:   Supervising Provider    Answer:   Tresa Garter [id number]  . insulin detemir (LEVEMIR) 100 UNIT/ML injection    Sig: Inject 0.1 mLs (10 Units total) into the skin at bedtime.    Dispense:  10 mL    Refill:  11    Order Specific Question:   Supervising Provider    Answer:   Tresa Garter [[id number]]    Follow-up: Return in about 1 month (around 11/26/2016) for DM/HTN/Hyperlipidemia.   Scott Spruce FNP

## 2016-11-02 ENCOUNTER — Other Ambulatory Visit: Payer: Self-pay | Admitting: Internal Medicine

## 2016-11-02 ENCOUNTER — Other Ambulatory Visit: Payer: Self-pay | Admitting: Family Medicine

## 2016-11-02 MED FILL — BRILINTA 90 MG TABLET: 90 | 30 days supply | Qty: 60 | Fill #0

## 2016-11-02 MED FILL — METOPROLOL TARTRATE 25 MG T: 25 | 30 days supply | Qty: 60 | Fill #0

## 2016-11-06 ENCOUNTER — Encounter: Payer: Self-pay | Admitting: Physician Assistant

## 2016-11-06 ENCOUNTER — Ambulatory Visit (INDEPENDENT_AMBULATORY_CARE_PROVIDER_SITE_OTHER): Payer: Self-pay | Admitting: Physician Assistant

## 2016-11-06 VITALS — BP 118/76 | HR 61 | Ht 68.0 in | Wt 162.0 lb

## 2016-11-06 DIAGNOSIS — I2510 Atherosclerotic heart disease of native coronary artery without angina pectoris: Secondary | ICD-10-CM

## 2016-11-06 DIAGNOSIS — E785 Hyperlipidemia, unspecified: Secondary | ICD-10-CM

## 2016-11-06 DIAGNOSIS — I10 Essential (primary) hypertension: Secondary | ICD-10-CM

## 2016-11-06 DIAGNOSIS — Z794 Long term (current) use of insulin: Secondary | ICD-10-CM

## 2016-11-06 DIAGNOSIS — E119 Type 2 diabetes mellitus without complications: Secondary | ICD-10-CM

## 2016-11-06 NOTE — Progress Notes (Signed)
Patient is [place] speaking, he declined need for interpreter services today. He voiced clear knowledge of instructions with use of teach-back method.

## 2016-11-06 NOTE — Progress Notes (Signed)
Cardiology Office Note    Date:  11/06/2016   ID:  Scott Mcclain, DOB 1976-05-15, MRN [id number]  PCP:  Alfonse Spruce, FNP  Cardiologist:  Dr. Martinique  Chief Complaint  Patient presents with  . Follow-up    seen for Dr. Martinique    History of Present Illness:  Scott Mcclain is a 41 y.o. male with PMH of HTN, HLD, DM II and recently diagnosed CAD. He had acute inferior STEMI on 08/31/2016. Emergent cardiac catheterization revealed 75% lateral first marginal lesion, 40% mid LAD lesion, 100% proximal RCA occlusion treated with Promus Premiere 3.5 x 38 mm DES, EF 45-50%. Echocardiogram obtained the next day showed EF 55%. His troponin peaked at 13.85. He was treated with aspirin and Brilinta. He was also diagnosed with new diabetes with hemoglobin A1c of 11. He was started on metformin and set up to follow-up with [facility]. He was last seen by Dr. Martinique for hospital discharge on 09/06/2016.  He presents today for follow-up, he has not been very compliant with insulin. He is compliant with metformin. Since his previous hospitalization, he has not had any further chest pain. He is very much compliant on aspirin and Brilinta and states he has no problem obtaining the present time. Sometimes he has dizziness on the metoprolol, however today's blood pressure is actually normal. He says the dizziness is very mild and certainly does not interfere with his work or his lifestyle. I will continue on the current medication. I asked him to follow-up with his primary care physician who will continue to manage his diabetes. Metformin alone is unlikely to control his diabetes given her hemoglobin A1c of 11. I also recommended repeating fasting lipid panel and LFT.   Past Medical History:  Diagnosis Date  . CAD in native artery    a. Inf STEMI 08/2016: [facility] 08/31/16 showed 100% prox RCA, 75% OM1, mild LV dysfunction EF 45-50%, normal LVEDP -> s/p successful stenting of  prox-mid RCA with DES, consider PCI of OM1 if recurrent angina. EF 55% by f/u echo same admission.  . Hyperlipidemia   . Hypertension   . Uncontrolled diabetes mellitus ([place])     Past Surgical History:  Procedure Laterality Date  . CORONARY STENT INTERVENTION N/A 08/31/2016   Procedure: Coronary Stent Intervention;  Surgeon: Peter M Martinique, MD;  Location: [facility];  Service: Cardiovascular;  Laterality: N/A;  . LEFT HEART CATH AND CORONARY ANGIOGRAPHY N/A 08/31/2016   Procedure: Left Heart Cath and Coronary Angiography;  Surgeon: Peter M Martinique, MD;  Location: [facility];  Service: Cardiovascular;  Laterality: N/A;    Current Medications: Outpatient Medications Prior to Visit  Medication Sig Dispense Refill  . aspirin EC 81 MG EC tablet Take 1 tablet (81 mg total) by mouth daily. 90 tablet 3  . atorvastatin (LIPITOR) 80 MG tablet TAKE 1 TABLET BY MOUTH EVERY EVENING. 30 tablet 0  . Blood Glucose Monitoring Suppl (TRUE METRIX METER) w/Device KIT Check blood sugar fasting and at bedtime 1 kit 0  . BRILINTA 90 MG TABS tablet TAKE 1 TABLET BY MOUTH 2 TIMES DAILY. 60 tablet 2  . glucose blood (TRUE METRIX BLOOD GLUCOSE TEST) test strip Use as instructed 100 each 12  . insulin detemir (LEVEMIR) 100 UNIT/ML injection Inject 0.1 mLs (10 Units total) into the skin at bedtime. 10 mL 11  . metFORMIN (GLUCOPHAGE-XR) 500 MG 24 hr tablet Take 2 tablets (1,000 mg total) by  mouth 2 (two) times daily with a meal. 180 tablet 1  . metoprolol tartrate (LOPRESSOR) 25 MG tablet TAKE 1 TABLET BY MOUTH 2 TIMES DAILY. 60 tablet 2  . nitroGLYCERIN (NITROSTAT) 0.4 MG SL tablet Place 1 tablet (0.4 mg total) under the tongue every 5 (five) minutes as needed for chest pain (up to 3 doses). 25 tablet 3  . TRUEPLUS LANCETS 28G MISC Check blood sugars fasting and at bedtime and record 100 each 1   Facility-Administered Medications Prior to Visit  Medication Dose Route Frequency Provider Last Rate Last  Dose  . insulin aspart (novoLOG) injection 10 Units  10 Units Subcutaneous Once Alfonse Spruce, FNP         Allergies:   Patient has no known allergies.   Social History   Social History  . Marital status: Married    Spouse name: N/A  . Number of children: N/A  . Years of education: N/A   Social History Main Topics  . Smoking status: Never Smoker  . Smokeless tobacco: Never Used  . Alcohol use Yes     Comment: occ  . Drug use: No  . Sexual activity: Not Asked   Other Topics Concern  . None   Social History Narrative  . None     Family History:  The patient's family history includes Diabetes in his mother; Heart attack in his father.   ROS:   Please see the history of present illness.    ROS All other systems reviewed and are negative.   PHYSICAL EXAM:   VS:  BP 118/76   Pulse 61   Ht _0  (1.727 m)   Wt 162 lb (73.5 kg)   BMI 24.63 kg/m    GEN: Well nourished, well developed, in no acute distress  HEENT: normal  Neck: no JVD, carotid bruits, or masses Cardiac: RRR; no murmurs, rubs, or gallops,no edema  Respiratory:  clear to auscultation bilaterally, normal work of breathing GI: soft, nontender, nondistended, + BS MS: no deformity or atrophy  Skin: warm and dry, no rash Neuro:  Alert and Oriented x 3, Strength and sensation are intact Psych: euthymic mood, full affect  Wt Readings from Last 3 Encounters:  11/06/16 162 lb (73.5 kg)  10/26/16 156 lb 12.8 oz (71.1 kg)  09/27/16 160 lb 12.8 oz (72.9 kg)      Studies/Labs Reviewed:   EKG:  EKG is ordered today.  The ekg ordered today demonstrates NSR with ST-T wave changes  Recent Labs: 09/02/2016: ALT 86; BUN 8; Creatinine, Ser 0.73; Hemoglobin 14.1; Platelets 157; Potassium 3.5; Sodium 139   Lipid Panel    Component Value Date/Time   CHOL 187 09/01/2016 0334   TRIG 89 09/01/2016 0334   HDL 51 09/01/2016 0334   CHOLHDL 3.7 09/01/2016 0334   VLDL 18 09/01/2016 0334   LDLCALC 118 (H)  09/01/2016 0334    Additional studies/ records that were reviewed today include:   Cath 08/31/2016 Conclusion     Lat 1st Mrg lesion, 75 %stenosed.  Mid LAD lesion, 40 %stenosed.  There is mild left ventricular systolic dysfunction.  LV end diastolic pressure is normal.  The left ventricular ejection fraction is 45-50% by visual estimate.  A STENT PROMUS PREM MR [id number] drug eluting stent was successfully placed.  Prox RCA lesion, 100 %stenosed.  Post intervention, there is a 0% residual stenosis.   1. 2 vessel obstructive CAD    - 100% proximal RCA    - 75%  branch of OM1 2. Mild LV dysfunction 3. Normal LVEDP 4. Successful stenting of the proximal to mid RCA with DES.  Plan: DAPT for one year. Statin therapy. Beta blocker. He may be a candidate for fast track discharge if no complications. If he has recurrent angina consider stenting of OM1 branch.    Echo 09/01/2016 LV EF: 55%  Study Conclusions  - Left ventricle: The cavity size was normal. Systolic function was   normal. The estimated ejection fraction was 55%. Wall motion was   normal; there were no regional wall motion abnormalities. Left   ventricular diastolic function parameters were normal. - Aortic valve: Trileaflet; normal thickness leaflets. There was no   regurgitation. - Aortic root: The aortic root was normal in size. - Mitral valve: Transvalvular velocity was within the normal range.   There was no evidence for stenosis. There was no regurgitation. - Right ventricle: Systolic function was normal. - Right atrium: The atrium was normal in size. - Tricuspid valve: There was trivial regurgitation. - Pulmonic valve: There was no regurgitation. - Pulmonary arteries: Systolic pressure was within the normal   range. - Inferior vena cava: The vessel was normal in size. - Pericardium, extracardiac: There was no pericardial effusion.  Impressions:  - Normal study. Normal global longitudinal strain  - 22.7%.  ASSESSMENT:    1. CAD in native artery   2. Essential hypertension   3. Hyperlipidemia, unspecified hyperlipidemia type   4. Controlled type 2 diabetes mellitus without complication, with long-term current use of insulin (HCC)      PLAN:  In order of problems listed above:  1. CAD: Compliant with dual antiplatelet therapy. Continued to have residual 75% disease in first marginal, and 40% LAD disease. He has not had any exertional chest discomfort. He has no problem obtaining Brilinta.   2. HTN: Blood pressure well controlled, he does have occasional dizziness on metoprolol, however it is tolerable.   3. HLD: Continue on Lipitor daily, planning for repeat fasting lipid panel and LFTs. Unfortunately he is not fasting today.  4. DM II: on insulin. He is actually not very compliant with insulin, he is compliant with metformin. I told the patient that metformin alone is very unlikely to control his diabetes given a hemoglobin A1c of 11. Will defer to his primary care physician for further medication adjustment.    Medication Adjustments/Labs and Tests Ordered: Current medicines are reviewed at length with the patient today.  Concerns regarding medicines are outlined above.  Medication changes, Labs and Tests ordered today are listed in the Patient Instructions below. Patient Instructions  Medication Instructions:   No change  Labwork:   Please have the fasting lipid and liver function tests at your convenience in 1-2 weeks.  Testing/Procedures:  none  Follow-Up:  4-5 months with Dr. Martinique  If you need a refill on your cardiac medications before your next appointment, please call your pharmacy.      Hilbert Corrigan, [place]  11/06/2016 [facility] [address], [place], [place]  [address] Phone: [phone]; Fax: [phone]

## 2016-11-06 NOTE — Patient Instructions (Signed)
Medication Instructions:   No change  Labwork:   Please have the fasting lipid and liver function tests at your convenience in 1-2 weeks.  Testing/Procedures:  none  Follow-Up:  4-5 months with Dr. [place]Jordan  If you need a refill on your cardiac medications before your next appointment, please call your pharmacy.

## 2016-11-17 MED FILL — ATORVASTATIN 80 MG TABLET: 80 | 30 days supply | Qty: 30 | Fill #0

## 2016-11-20 LAB — HEPATIC FUNCTION PANEL
ALBUMIN: 4.4 g/dL (ref 3.5–5.5)
ALK PHOS: 77 IU/L (ref 39–117)
ALT: 47 IU/L — ABNORMAL HIGH (ref 0–44)
AST: 28 IU/L (ref 0–40)
BILIRUBIN TOTAL: 0.3 mg/dL (ref 0.0–1.2)
BILIRUBIN, DIRECT: 0.1 mg/dL (ref 0.00–0.40)
TOTAL PROTEIN: 7.1 g/dL (ref 6.0–8.5)

## 2016-11-20 LAB — LIPID PANEL
CHOLESTEROL TOTAL: 135 mg/dL (ref 100–199)
Chol/HDL Ratio: 2.9 ratio (ref 0.0–5.0)
HDL: 47 mg/dL (ref >39)
LDL Calculated: 67 mg/dL (ref 0–99)
Triglycerides: 104 mg/dL (ref 0–149)
VLDL Cholesterol Cal: 21 mg/dL (ref 5–40)

## 2016-11-20 NOTE — Progress Notes (Signed)
Cholesterol improved compare to 2 month ago

## 2016-11-20 NOTE — Progress Notes (Signed)
Liver function stable.

## 2016-11-27 ENCOUNTER — Ambulatory Visit: Payer: Self-pay | Admitting: Family Medicine

## 2016-11-30 MED FILL — BRILINTA 90 MG TABLET: 90 | 8 days supply | Qty: 16 | Fill #1

## 2016-12-11 MED FILL — METFORMIN HCL ER 500 MG TAB: 500 | 30 days supply | Qty: 120 | Fill #1

## 2016-12-11 MED FILL — BRILINTA 90 MG TABLET: 90 | 8 days supply | Qty: 16 | Fill #2

## 2016-12-11 MED FILL — ?METOPROLOL 25 MG TABLET: 25 | 30 days supply | Qty: 60 | Fill #1

## 2016-12-11 MED FILL — TRUE METRIX TEST STRIP: 25 days supply | Qty: 100 | Fill #2

## 2016-12-18 ENCOUNTER — Other Ambulatory Visit: Payer: Self-pay | Admitting: Family Medicine

## 2016-12-18 MED FILL — BRILINTA 90 MG TABLET: 90 | 8 days supply | Qty: 16 | Fill #3

## 2016-12-18 MED FILL — ATORVASTATIN 80 MG TABLET: 80 | 30 days supply | Qty: 30 | Fill #1

## 2016-12-19 ENCOUNTER — Other Ambulatory Visit: Payer: Self-pay | Admitting: Physician Assistant

## 2016-12-19 DIAGNOSIS — IMO0001 Reserved for inherently not codable concepts without codable children: Secondary | ICD-10-CM

## 2016-12-19 DIAGNOSIS — E1165 Type 2 diabetes mellitus with hyperglycemia: Principal | ICD-10-CM

## 2016-12-19 MED FILL — TRUEplus LANCETS 28G MISC: 25 days supply | Qty: 100 | Fill #0

## 2016-12-21 ENCOUNTER — Ambulatory Visit: Payer: Self-pay | Admitting: Family Medicine

## 2016-12-27 MED FILL — BRILINTA 90 MG TABLET: 90 | 30 days supply | Qty: 60 | Fill #4

## 2017-01-05 ENCOUNTER — Ambulatory Visit: Payer: Self-pay | Attending: Family Medicine | Admitting: Family Medicine

## 2017-01-05 ENCOUNTER — Encounter: Payer: Self-pay | Admitting: Family Medicine

## 2017-01-05 VITALS — BP 98/64 | HR 73 | Temp 98.5°F | Resp 18 | Ht 66.0 in | Wt 156.0 lb

## 2017-01-05 DIAGNOSIS — E785 Hyperlipidemia, unspecified: Secondary | ICD-10-CM | POA: Insufficient documentation

## 2017-01-05 DIAGNOSIS — I10 Essential (primary) hypertension: Secondary | ICD-10-CM | POA: Insufficient documentation

## 2017-01-05 DIAGNOSIS — Z79899 Other long term (current) drug therapy: Secondary | ICD-10-CM | POA: Insufficient documentation

## 2017-01-05 DIAGNOSIS — Z794 Long term (current) use of insulin: Secondary | ICD-10-CM | POA: Insufficient documentation

## 2017-01-05 DIAGNOSIS — E1165 Type 2 diabetes mellitus with hyperglycemia: Secondary | ICD-10-CM | POA: Insufficient documentation

## 2017-01-05 DIAGNOSIS — IMO0001 Reserved for inherently not codable concepts without codable children: Secondary | ICD-10-CM

## 2017-01-05 DIAGNOSIS — Z7982 Long term (current) use of aspirin: Secondary | ICD-10-CM | POA: Insufficient documentation

## 2017-01-05 DIAGNOSIS — I252 Old myocardial infarction: Secondary | ICD-10-CM | POA: Insufficient documentation

## 2017-01-05 MED ORDER — METFORMIN HCL ER 500 MG PO TB24: 1000.0000 mg | ORAL_TABLET | Freq: Two times a day (BID) | ORAL | 1 refills | Status: DC

## 2017-01-05 MED ORDER — GLIPIZIDE 5 MG PO TABS: 2.5000 mg | ORAL_TABLET | Freq: Every day | ORAL | 2 refills | Status: DC

## 2017-01-05 MED ORDER — TICAGRELOR 90 MG PO TABS: 90.0000 mg | ORAL_TABLET | Freq: Two times a day (BID) | ORAL | 3 refills | Status: DC

## 2017-01-05 MED ORDER — METOPROLOL TARTRATE 25 MG PO TABS: 25.0000 mg | ORAL_TABLET | Freq: Two times a day (BID) | ORAL | 3 refills | Status: DC

## 2017-01-05 MED ORDER — ATORVASTATIN CALCIUM 80 MG PO TABS: 80.0000 mg | ORAL_TABLET | Freq: Every evening | ORAL | 3 refills | Status: DC

## 2017-01-05 NOTE — Progress Notes (Signed)
Subjective:  Patient ID: Scott Mcclain, male    DOB: August 14, 1975  Age: 41 y.o. MRN: [id number]  CC: Hypertension   HPI Scott Mcclain presents for diabetes and HTN follow up. Patient presents for follow up of diabetes. Symptoms: hyperglycemia. Symptoms have been basically asymptomatic. Patient denies foot ulcerations, nausea, paresthesia of the feet, visual disturbances and vomitting.  Evaluation to date has been included: fasting blood sugar, fasting lipid panel, hemoglobin A1C and microalbuminuria.  Home sugars: patient checks blood sugars BID He reports am blood sugars range 140-160's, pm blood sugars range 170-200's. Treatment to date: Continued metformin which has been somewhat effective. History of hypertension. He is adherent to low salt diet and he is exercising.  He does not check BP at home. Cardiac symptoms none. Patient denies chest pain, chest pressure/discomfort, claudication, dyspnea, lower extremity edema, near-syncope, palpitations and syncope.  Cardiovascular risk factors: dyslipidemia, hypertension and male gender. Use of agents associated with hypertension: none. History of target organ damage: angina/ prior myocardial infarction.   Outpatient Medications Prior to Visit  Medication Sig Dispense Refill  . aspirin EC 81 MG EC tablet Take 1 tablet (81 mg total) by mouth daily. 90 tablet 3  . Blood Glucose Monitoring Suppl (TRUE METRIX METER) w/Device KIT Check blood sugar fasting and at bedtime 1 kit 0  . glucose blood (TRUE METRIX BLOOD GLUCOSE TEST) test strip Use as instructed 100 each 12  . nitroGLYCERIN (NITROSTAT) 0.4 MG SL tablet Place 1 tablet (0.4 mg total) under the tongue every 5 (five) minutes as needed for chest pain (up to 3 doses). 25 tablet 3  . TRUEPLUS LANCETS 28G MISC CHECK BLOOD SUGARS FASTING AND AT BEDTIME AND RECORD 100 each 2  . atorvastatin (LIPITOR) 80 MG tablet TAKE 1 TABLET BY MOUTH EVERY EVENING. 30 tablet 0  . BRILINTA 90 MG TABS tablet  TAKE 1 TABLET BY MOUTH 2 TIMES DAILY. 60 tablet 2  . insulin detemir (LEVEMIR) 100 UNIT/ML injection Inject 0.1 mLs (10 Units total) into the skin at bedtime. 10 mL 11  . metFORMIN (GLUCOPHAGE-XR) 500 MG 24 hr tablet Take 2 tablets (1,000 mg total) by mouth 2 (two) times daily with a meal. 180 tablet 1  . metoprolol tartrate (LOPRESSOR) 25 MG tablet TAKE 1 TABLET BY MOUTH 2 TIMES DAILY. 60 tablet 2   Facility-Administered Medications Prior to Visit  Medication Dose Route Frequency Provider Last Rate Last Dose  . insulin aspart (novoLOG) injection 10 Units  10 Units Subcutaneous Once Jaydon Avina R, FNP        ROS Review of Systems  Constitutional: Negative.   Eyes: Negative.   Respiratory: Negative.   Cardiovascular: Negative.   Gastrointestinal: Negative.   Musculoskeletal: Negative.   Skin: Negative.   Neurological: Negative.     Objective:  BP 98/64 (BP Location: Left Arm, Patient Position: Sitting, Cuff Size: Normal)   Pulse 73   Temp 98.5 F (36.9 C) (Oral)   Resp 18   Ht 5' 6" (1.676 m)   Wt 156 lb (70.8 kg)   SpO2 100%   BMI 25.18 kg/m   BP/Weight 01/05/2017 11/06/2016 8/89/1694  Systolic BP 98 503 99  Diastolic BP 64 76 62  Wt. (Lbs) 156 162 156.8  BMI 25.18 24.63 23.16     Physical Exam  Constitutional: He appears well-developed and well-nourished.  Eyes: Pupils are equal, round, and reactive to light. Conjunctivae are normal.  Neck: Normal range of motion. Neck supple. No JVD present.  Cardiovascular: Normal rate, regular rhythm, normal heart sounds and intact distal pulses.   Pulmonary/Chest: Effort normal and breath sounds normal.  Abdominal: Soft. Bowel sounds are normal. There is no tenderness.  Skin: Skin is warm and dry.  Nursing note and vitals reviewed.   Assessment & Plan:   Problem List Items Addressed This Visit      Cardiovascular and Mediastinum   Essential hypertension   Relevant Medications   metoprolol tartrate (LOPRESSOR) 25 MG  tablet   ticagrelor (BRILINTA) 90 MG TABS tablet   atorvastatin (LIPITOR) 80 MG tablet     Endocrine   Uncontrolled diabetes mellitus ([facility]) - Primary   Glipizide was added for better glycemic control.   Follow up with clinical pharmacist in 2 weeks.   Follow up with PCP in 3 months.   Relevant Medications   metFORMIN (GLUCOPHAGE-XR) 500 MG 24 hr tablet   glipiZIDE (GLUCOTROL) 5 MG tablet   atorvastatin (LIPITOR) 80 MG tablet   Other Relevant Orders   Ambulatory referral to Ophthalmology     Other   Hyperlipidemia   Relevant Medications   metoprolol tartrate (LOPRESSOR) 25 MG tablet   atorvastatin (LIPITOR) 80 MG tablet   Other Relevant Orders   Lipid Panel      Meds ordered this encounter  Medications  . metFORMIN (GLUCOPHAGE-XR) 500 MG 24 hr tablet    Sig: Take 2 tablets (1,000 mg total) by mouth 2 (two) times daily with a meal.    Dispense:  180 tablet    Refill:  1    Order Specific Question:   Supervising Provider    Answer:   Tresa Garter [id number]  . glipiZIDE (GLUCOTROL) 5 MG tablet    Sig: Take 0.5 tablets (2.5 mg total) by mouth daily before breakfast.    Dispense:  30 tablet    Refill:  2    Order Specific Question:   Supervising Provider    Answer:   Tresa Garter [id number]  . metoprolol tartrate (LOPRESSOR) 25 MG tablet    Sig: Take 1 tablet (25 mg total) by mouth 2 (two) times daily.    Dispense:  180 tablet    Refill:  3    Must have office visit for refills.    Order Specific Question:   Supervising Provider    Answer:   Tresa Garter [id number]  . ticagrelor (BRILINTA) 90 MG TABS tablet    Sig: Take 1 tablet (90 mg total) by mouth 2 (two) times daily.    Dispense:  180 tablet    Refill:  3    Must have office visit for refills.    Order Specific Question:   Supervising Provider    Answer:   Tresa Garter [id number]  . atorvastatin (LIPITOR) 80 MG tablet    Sig: Take 1 tablet (80 mg total) by mouth every evening.     Dispense:  90 tablet    Refill:  3    Order Specific Question:   Supervising Provider    Answer:   Tresa Garter [[id number]]    Follow-up: Return in about 2 weeks (around 01/19/2017) for DM check with [facility].   Alfonse Spruce FNP

## 2017-01-05 NOTE — Patient Instructions (Signed)
Type 2 Diabetes Mellitus, Self Care, Adult When you have type 2 diabetes (type 2 diabetes mellitus), you must keep your blood sugar (glucose) under control. You can do this with:  Nutrition.  Exercise.  Lifestyle changes.  Medicines or insulin, if needed.  Support from your doctors and others.  How do I manage my blood sugar?  Check your blood sugar level every day, as often as told.  Call your doctor if your blood sugar is above your goal numbers for 2 tests in a row.  Have your A1c (hemoglobin A1c) level checked at least two times a year. Have it checked more often if your doctor tells you to. Your doctor will set treatment goals for you. Generally, you should have these blood sugar levels:  Before meals (preprandial): 80-130 mg/dL (4.4-7.2 mmol/L).  After meals (postprandial): lower than 180 mg/dL (10 mmol/L).  A1c level: less than 7%.  What do I need to know about high blood sugar? High blood sugar is called hyperglycemia. Know the signs of high blood sugar. Signs may include:  Feeling: ? Thirsty. ? Hungry. ? Very tired.  Needing to pee (urinate) more than usual.  Blurry vision.  What do I need to know about low blood sugar? Low blood sugar is called hypoglycemia. This is when blood sugar is at or below 70 mg/dL (3.9 mmol/L). Symptoms may include:  Feeling: ? Hungry. ? Worried or nervous (anxious). ? Sweaty and clammy. ? Confused. ? Dizzy. ? Sleepy. ? Sick to your stomach (nauseous).  Having: ? A fast heartbeat (palpitations). ? A headache. ? A change in your vision. ? Jerky movements that you cannot control (seizure). ? Nightmares. ? Tingling or no feeling (numbness) around the mouth, lips, or tongue.  Having trouble with: ? Talking. ? Paying attention (concentrating). ? Moving (coordination). ? Sleeping.  Shaking.  Passing out (fainting).  Getting upset easily (irritability).  Treating low blood sugar  To treat low blood sugar, eat or  drink something sugary right away. If you can think clearly and swallow safely, follow the 15:15 rule:  Take 15 grams of a fast-acting carb (carbohydrate). Some fast-acting carbs are: ? 1 tube of glucose gel. ? 3 sugar tablets (glucose pills). ? 6-8 pieces of hard candy. ? 4 oz (120 mL) of fruit juice. ? 4 oz (120 mL) regular (not diet) soda.  Check your blood sugar 15 minutes after you take the carb.  If your blood sugar is still at or below 70 mg/dL (3.9 mmol/L), take 15 grams of a carb again.  If your blood sugar does not go above 70 mg/dL (3.9 mmol/L) after 3 tries, get help right away.  After your blood sugar goes back to normal, eat a meal or a snack within 1 hour.  Treating very low blood sugar If your blood sugar is at or below 54 mg/dL (3 mmol/L), you have very low blood sugar (severe hypoglycemia). This is an emergency. Do not wait to see if the symptoms will go away. Get medical help right away. Call your local emergency services (911 in the [place] Do not drive yourself to the hospital. If you have very low blood sugar and you cannot eat or drink, you may need a glucagon shot (injection). A family member or friend should learn how to check your blood sugar and how to give you a glucagon shot. Ask your doctor if you need to have a glucagon shot kit at home. What else is important to manage my diabetes? Medicine  Follow these instructions about insulin and diabetes medicines:  Take them as told by your doctor.  Adjust them as told by your doctor.  Do not run out of them.  Having diabetes can raise your risk for other long-term conditions. These include heart or kidney disease. Your doctor may prescribe medicines to help prevent problems from diabetes. Food   Make healthy food choices. These include: ? Chicken, fish, egg whites, and beans. ? Oats, whole wheat, bulgur, brown rice, quinoa, and millet. ? Fresh fruits and vegetables. ? Low-fat dairy products. ? Nuts,  avocado, olive oil, and canola oil.  Make a food plan with a specialist (dietitian).  Follow instructions from your doctor about what you cannot eat or drink.  Drink enough fluid to keep your pee (urine) clear or pale yellow.  Eat healthy snacks between healthy meals.  Keep track of carbs that you eat. Read food labels. Learn food serving sizes.  Follow your sick day plan when you cannot eat or drink normally. Make this plan with your doctor so it is ready to use. Activity  Exercise at least 3 times a week.  Do not go more than 2 days without exercising.  Talk with your doctor before you start a new exercise. Your doctor may need to adjust your insulin, medicines, or food. Lifestyle   Do not use any tobacco products. These include cigarettes, chewing tobacco, and e-cigarettes.If you need help quitting, ask your doctor.  Ask your doctor how much alcohol is safe for you.  Learn to deal with stress. If you need help with this, ask your doctor. Body care  Stay up to date with your shots (immunizations).  Have your eyes and feet checked by a doctor as often as told.  Check your skin and feet every day. Check for cuts, bruises, redness, blisters, or sores.  Brush your teeth and gums two times a day.  Floss at least one time a day.  Go to the dentist least one time every 6 months.  Stay at a healthy weight. General instructions   Take over-the-counter and prescription medicines only as told by your doctor.  Share your diabetes care plan with: ? Your work or school. ? People you live with.  Check your pee (urine) for ketones: ? When you are sick. ? As told by your doctor.  Carry a card or wear jewelry that says that you have diabetes.  Ask your doctor: ? Do I need to meet with a diabetes educator? ? Where can I find a support group for people with diabetes?  Keep all follow-up visits as told by your doctor. This is important. Where to find more information: To  learn more about diabetes, visit:  American Diabetes Association: www.diabetes.org  American Association of Diabetes Educators: www.diabeteseducator.org/patient-resources  This information is not intended to replace advice given to you by your health care provider. Make sure you discuss any questions you have with your health care provider. Document Released: 09/13/2015 Document Revised: 10/28/2015 Document Reviewed: 06/25/2015 Elsevier Interactive Patient Education  [employer].

## 2017-01-08 MED FILL — glipiZIDE 5 MG TABS: 5 | 30 days supply | Qty: 15 | Fill #0

## 2017-01-08 MED FILL — METFORMIN HCL ER 500 MG TAB: 500 | 30 days supply | Qty: 180 | Fill #0

## 2017-01-08 MED FILL — METOPROLOL TARTRATE 25 MG T: 25 | 30 days supply | Qty: 60 | Fill #0

## 2017-01-10 MED FILL — TRUE METRIX TEST STRIP: 25 days supply | Qty: 100 | Fill #3

## 2017-01-24 ENCOUNTER — Ambulatory Visit: Payer: Self-pay | Attending: Family Medicine | Admitting: Pharmacist

## 2017-01-24 VITALS — BP 106/73 | HR 88

## 2017-01-24 DIAGNOSIS — IMO0001 Reserved for inherently not codable concepts without codable children: Secondary | ICD-10-CM

## 2017-01-24 DIAGNOSIS — E1165 Type 2 diabetes mellitus with hyperglycemia: Secondary | ICD-10-CM | POA: Insufficient documentation

## 2017-01-24 LAB — POCT GLYCOSYLATED HEMOGLOBIN (HGB A1C): Hemoglobin A1C: 7.3

## 2017-01-24 MED FILL — TRUEplus LANCETS 28G MISC: 25 days supply | Qty: 100 | Fill #1

## 2017-01-24 MED FILL — BRILINTA 90 MG TABLET: 90 | 6 days supply | Qty: 12 | Fill #5

## 2017-01-24 MED FILL — ATORVASTATIN 80 MG TABLET: 80 | 30 days supply | Qty: 30 | Fill #0

## 2017-01-24 MED FILL — TRUE METRIX TEST STRIP: 25 days supply | Qty: 100 | Fill #4

## 2017-01-24 NOTE — Patient Instructions (Signed)
Thanks for coming to see [place]!  Stop the glipizide for now.   Continue metformin  Come back in 1 month for blood sugar follow up   Recuento de carbohidratos para la diabetes mellitus en los adultos Carbohydrate Counting for Diabetes Mellitus, Adult El recuento de carbohidratos es un mtodo destinado a Midwife un registro de la cantidad de carbohidratos que se ingieren. La ingesta natural de carbohidratos aumenta la cantidad de azcar (glucosa) en la sangre. El recuento de la cantidad de carbohidratos que se ingieren sirve para que el nivel de glucosa sangunea (glucemia) permanezca dentro de los lmites normales, lo que ayuda a Pharmacologist la diabetes (diabetes mellitus) bajo control. Es importante saber la cantidad de carbohidratos que se pueden ingerir en cada comida sin correr Surveyor, minerals. Esto es Government social research officer. Un especialista en alimentacin y nutricin (nutricionista certificado) puede ayudarlo a crear un plan de alimentacin y a calcular la cantidad de carbohidratos que debe ingerir en cada comida y colacin. Los siguientes alimentos incluyen carbohidratos:  Granos, como panes y cereales.  Frijoles secos y productos con soja.  Verduras con almidn, como papas, guisantes y maz.  Nils Pyle y jugos de frutas.  Leche y Dentist.  Dulces y colaciones, como pasteles, galletas, caramelos, papas fritas y refrescos.  Cmo se calculan los carbohidratos? Hay dos maneras de calcular los carbohidratos de los alimentos. Puede usar cualquiera de [address] o [place] combinacin de [place]. Leer la etiqueta de informacin nutricional de los alimentos envasados La lista de informacin nutricional est incluida en las etiquetas de casi todas las bebidas y los alimentos envasados de los [place]. Incluye lo siguiente:  El tamao de la porcin.  Informacin sobre los nutrientes de cada porcin, incluidos los gramos (g) de carbohidratos por porcin.  Para usar la informacin  nutricional:  Decida cuntas porciones va a comer.  Multiplique la cantidad de porciones por el nmero de carbohidratos por porcin.  El resultado es la cantidad total de carbohidratos que comer.  Conocer los tamaos de las porciones estndar de otros alimentos Cuando coma alimentos que contienen carbohidratos que no estn envasados o no incluyen la informacin nutricional en la etiqueta, debe medir las porciones para poder calcular la cantidad de carbohidratos:  Mida los alimentos que comer con una balanza de alimentos o una taza medidora, si es necesario.  Decida cuntas porciones de Programmer, systems.  Multiplique el nmero de porciones por15. La mayora de los alimentos con alto contenido de carbohidratos contienen unos 15g de carbohidratos por porcin. ? Por ejemplo, si come 8onzas (170g) de fresas, habr comido 2porciones y 30g de carbohidratos (2porciones x 15g=30g).  En el caso de las comidas que contienen mezclas de ms de un alimento, como las sopas y los guisos, debe calcular los carbohidratos de cada alimento que se incluye.  La siguiente [employer] tamaos de las porciones estndar de los alimentos corrientes con alto contenido de carbohidratos. Cada una de estas porciones tiene aproximadamente 15g de carbohidratos:  pan de hamburguesa o muffin ingls.  onza (27ml) de almbar.  onza (14g) de gelatina.  1rebanada de pan.  1tortilla de seispulgadas.  3onzas (85g) de arroz o pasta cocidos.  4onzas (113g) de frijoles secos cocidos.  4onzas (113g) de verduras con almidn, como guisantes, maz o papas.  4onzas (113g) de cereal caliente.  4onzas (113g) de pur de papas o de una papa grande al horno.  4onzas (113g) de frutas en lata o congeladas.  4onzas ( ) de jugo  de frutas.  4a 6galletas.  6croquetas de pollo.  6onzas (170g) de cereales secos sin azcar.  6onzas (170g) de yogur descremado sin  ningn agregado o de yogur endulzado con edulcorante artificial.  8onzas ( ) de [place].  8onzas (170g) de frutas frescas o una fruta pequea.  24onzas (680g) de palomitas de maz.  Ejemplo de recuento de carbohidratos Ejemplo de comida  3onzas (85g) de pechuga de pollo.  6onzas (170g) de arroz integral.  4onzas (113g) de maz.  8onzas ( ) de leche.  8onzas (170g) de fresas con crema batida sin azcar. Clculo de carbohidratos 1. Identifique los alimentos que contienen carbohidratos: ? Arroz. ? Maz. ? Leche. ? Jinny Sanders. 2. Calcule cuntas porciones come de cada alimento: ? 2 porciones de arroz. ? 1 porcin de maz. ? 1 porcin de leche. ? 1 porcin de fresas. 3. Multiplique cada nmero de porciones por 15g: ? 2 porciones de arroz x 15 g = 30 g. ? 1 porcin de maz x 15 g = 15 g. ? 1 porcin de leche x 15 g = 15 g. ? 1 porcin de fresas x 15 g = 15 g. 4. Sume todas las cantidades para conocer el total de gramos de carbohidratos consumidos: ? 30g + 15g + 15g + 15g = 75g de carbohidratos en total. Esta informacin no tiene como fin reemplazar el consejo del mdico. Asegrese de hacerle al mdico cualquier pregunta que tenga. Document Released: 08/14/2011 Document Revised: 05/09/2016 Document Reviewed: 11/03/2015 Elsevier Interactive Patient Education  [employer].

## 2017-01-24 NOTE — Progress Notes (Signed)
    S:     Chief Complaint  Patient presents with  . Medication Management    Patient arrives in good spirits.  Presents for diabetes evaluation, education, and management at the request of Arrie Senate. Patient was referred on 01/05/17.  Patient was last seen by Primary Care Provider on 01/05/17. Patient refused interpreter.  Patient denies adherence with medications. He only takes the glipizide a few days a week due to dizziness Current diabetes medications include: metformin 1000 mg BID, glipizide 2.5 mg daily.  Current hypertension medications include:   Patient denies hypoglycemic events. However, patient reports that when he took the glipizide he got really dizzy and felt faint and had to eat something sweet to feel better.  Patient reported dietary habits: trying to watch carb intake. Eats rice and wheat bread.   Patient denies nocturia.  Patient denies neuropathy. Patient denies visual changes. Patient reports self foot exams.    O:  Physical Exam   ROS   Lab Results  Component Value Date   HGBA1C 7.3 01/24/2017   Vitals:   01/24/17 0950  BP: 106/73  Pulse: 88    Home fasting CBG: 120s-150s 2 hour post-prandial/random CBG: <200.   A/P: Diabetes longstanding currently uncontrolled based on A1c of 7.3 but greatly improved from last A1c of 11.Still has some elevated CBGs at home. Patient denies hypoglycemic events and is able to verbalize appropriate hypoglycemia management plan. Patient denies adherence with medication. Control is suboptimal due to occasional dietary indiscretion.   Continue metformin 1000 mg BID. Stop glipizide for now as patient was not really taking it and it could be causing hypoglycemia (patient did not take readings when this happened). With A1c of 7.3 will just focus on lifestyle modifications for the next few weeks. Congratulated patient on A1c <8.   Next A1C anticipated November 2018.    Written patient instructions provided.   Total time in face to face counseling 20 minutes.   Follow up in Pharmacist Clinic Visit in 4 weeks.   Patient seen with Vickey Huger, PharmD Candidate

## 2017-02-02 MED FILL — BRILINTA 90 MG TABLET: 90 | 30 days supply | Qty: 60 | Fill #0

## 2017-02-12 MED FILL — ?METOPROLOL 25 MG TABLET: 25 | 30 days supply | Qty: 60 | Fill #1

## 2017-02-27 ENCOUNTER — Ambulatory Visit: Payer: Self-pay | Admitting: Pharmacist

## 2017-02-28 ENCOUNTER — Other Ambulatory Visit: Payer: Self-pay | Admitting: Family Medicine

## 2017-02-28 DIAGNOSIS — IMO0001 Reserved for inherently not codable concepts without codable children: Secondary | ICD-10-CM

## 2017-02-28 DIAGNOSIS — E1165 Type 2 diabetes mellitus with hyperglycemia: Principal | ICD-10-CM

## 2017-02-28 MED FILL — TRUE METRIX TEST STRIP: 25 days supply | Qty: 100 | Fill #5

## 2017-02-28 MED FILL — ATORVASTATIN 80 MG TABLET: 80 | 30 days supply | Qty: 30 | Fill #1

## 2017-02-28 MED FILL — TRUEplus LANCETS 28G MISC: 25 days supply | Qty: 100 | Fill #2

## 2017-03-06 ENCOUNTER — Ambulatory Visit: Payer: Self-pay | Attending: Family Medicine | Admitting: Pharmacist

## 2017-03-06 ENCOUNTER — Encounter: Payer: Self-pay | Admitting: Pharmacist

## 2017-03-06 VITALS — BP 109/70 | HR 68

## 2017-03-06 DIAGNOSIS — Z7984 Long term (current) use of oral hypoglycemic drugs: Secondary | ICD-10-CM | POA: Insufficient documentation

## 2017-03-06 DIAGNOSIS — E1165 Type 2 diabetes mellitus with hyperglycemia: Secondary | ICD-10-CM

## 2017-03-06 DIAGNOSIS — IMO0001 Reserved for inherently not codable concepts without codable children: Secondary | ICD-10-CM

## 2017-03-06 DIAGNOSIS — E119 Type 2 diabetes mellitus without complications: Secondary | ICD-10-CM | POA: Insufficient documentation

## 2017-03-06 MED FILL — BRILINTA 90 MG TABLET: 90 | 30 days supply | Qty: 60 | Fill #1

## 2017-03-06 MED FILL — ?METOPROLOL 25 MG TABLET: 25 | 30 days supply | Qty: 60 | Fill #2

## 2017-03-06 NOTE — Progress Notes (Signed)
    S:     Chief Complaint  Patient presents with  . Medication Management    Patient arrives in good spirits.  Presents for diabetes evaluation, education, and management at the request of Arrie Senate. Patient was referred on 01/05/17.  Patient was last seen by Primary Care Provider on 01/05/17. Patient refused interpreter.  Patient denies adherence with medications.  Current diabetes medications include: metformin 1000 mg BID  Patient denies hypoglycemic events. He does not feel dizzy anymore after stopping the glipizide.  Patient reported dietary habits: trying to watch carb intake. Eats rice and wheat bread.   Patient denies nocturia.  Patient denies neuropathy. Patient denies visual changes. Patient reports self foot exams.    O:  Physical Exam   ROS   Lab Results  Component Value Date   HGBA1C 7.3 01/24/2017   There were no vitals filed for this visit.  Home fasting CBG: 100s-140s (most in the 110s) 2 hour post-prandial/random CBG: <180, occasional 200 if he eats more than normal   A/P: Diabetes longstanding currently uncontrolled based on A1c of 7.3 but greatly improved from last A1c of 11.Still has some elevated CBGs at home but most sound like they are at goal - no meter or log book to confirm. Patient denies hypoglycemic events and is able to verbalize appropriate hypoglycemia management plan. Patient denies adherence with medication. Control is suboptimal due to occasional dietary indiscretion.   Continue metformin 1000 mg BID. Will not restart glipizide at this time due to hypoglycemia risk. Patient to continue to focus on lifestyle modifications. Congratulated him on his progress. Patient to return sooner if he has hypoglycemia or starts to see frequent readings in the 200s.   Next A1C anticipated November 2018.    Written patient instructions provided.  Total time in face to face counseling 20 minutes.   Follow up with PCP in 8 weeks.   Patient seen  with Vickey Huger, PharmD Candidate

## 2017-03-06 NOTE — Patient Instructions (Signed)
Thanks for coming to see me  Continue metformin  Follow up with Mandesia in 8 weeks  Come back sooner if you have readings <70 or if you numbers start to be >130 for multiple readings in the morning.

## 2017-03-29 MED FILL — TRUE METRIX TEST STRIP: 25 days supply | Qty: 100 | Fill #6

## 2017-03-29 MED FILL — TRUEplus LANCETS 28G MISC: 30 days supply | Qty: 100 | Fill #0

## 2017-04-09 MED FILL — ATORVASTATIN 80 MG TABLET: 80 | 30 days supply | Qty: 30 | Fill #2

## 2017-04-09 MED FILL — BRILINTA 90 MG TABLET: 90 | 30 days supply | Qty: 60 | Fill #2

## 2017-04-23 MED FILL — ?METOPROLOL 25 MG TABLET: 25 | 30 days supply | Qty: 60 | Fill #3

## 2017-04-30 ENCOUNTER — Ambulatory Visit: Payer: Self-pay | Admitting: Family Medicine

## 2017-05-02 MED FILL — TRUEplus LANCETS 28G MISC: 30 days supply | Qty: 100 | Fill #1

## 2017-05-04 MED FILL — TRUE METRIX TEST STRIP: 25 days supply | Qty: 100 | Fill #7

## 2017-05-10 MED FILL — METFORMIN HCL ER 500 MG TAB: 500 | 30 days supply | Qty: 120 | Fill #1

## 2017-05-10 MED FILL — BRILINTA 90 MG TABLET: 90 | 30 days supply | Qty: 60 | Fill #3

## 2017-05-10 MED FILL — ATORVASTATIN 80 MG TABLET: 80 | 30 days supply | Qty: 30 | Fill #3

## 2017-06-06 MED FILL — BRILINTA 90 MG TABLET: 90 | 30 days supply | Qty: 60 | Fill #4

## 2017-06-06 MED FILL — METOPROLOL TARTRATE 25 MG T: 25 | 30 days supply | Qty: 60 | Fill #4

## 2017-06-08 ENCOUNTER — Other Ambulatory Visit: Payer: Self-pay | Admitting: Pharmacist

## 2017-06-08 MED ORDER — GLUCOSE BLOOD VI STRP: ORAL_STRIP | 12 refills | Status: DC

## 2017-06-08 MED ORDER — ACCU-CHEK AVIVA PLUS W/DEVICE KIT: PACK | 0 refills | Status: DC

## 2017-06-08 MED ORDER — ACCU-CHEK SOFTCLIX LANCETS MISC: 12 refills | Status: DC

## 2017-06-08 MED ORDER — ACCU-CHEK SOFT TOUCH LANCETS MISC: 12 refills | Status: DC

## 2017-06-08 MED FILL — ACCU-CHEK SOFTCLIX LANCETS: 25 days supply | Qty: 100 | Fill #0

## 2017-06-08 MED FILL — ACCU-CHEK AVIVA PLUS TEST S: 30 days supply | Qty: 100 | Fill #0

## 2017-06-08 MED FILL — ACCU-CHEK AVIVA PLUS METER: W/DEVICE | 30 days supply | Qty: 1 | Fill #0

## 2017-06-21 ENCOUNTER — Other Ambulatory Visit: Payer: Self-pay | Admitting: Physician Assistant

## 2017-06-21 NOTE — Telephone Encounter (Signed)
REFILL 

## 2017-07-02 ENCOUNTER — Encounter: Payer: Self-pay | Admitting: Family Medicine

## 2017-07-02 ENCOUNTER — Other Ambulatory Visit: Payer: Self-pay

## 2017-07-02 ENCOUNTER — Ambulatory Visit: Payer: Self-pay | Attending: Family Medicine | Admitting: Family Medicine

## 2017-07-02 VITALS — BP 108/69 | HR 68 | Temp 98.2°F | Resp 12 | Wt 151.8 lb

## 2017-07-02 DIAGNOSIS — B353 Tinea pedis: Secondary | ICD-10-CM | POA: Insufficient documentation

## 2017-07-02 DIAGNOSIS — I252 Old myocardial infarction: Secondary | ICD-10-CM | POA: Insufficient documentation

## 2017-07-02 DIAGNOSIS — Z79899 Other long term (current) drug therapy: Secondary | ICD-10-CM | POA: Insufficient documentation

## 2017-07-02 DIAGNOSIS — E1165 Type 2 diabetes mellitus with hyperglycemia: Secondary | ICD-10-CM | POA: Insufficient documentation

## 2017-07-02 DIAGNOSIS — Z794 Long term (current) use of insulin: Secondary | ICD-10-CM | POA: Insufficient documentation

## 2017-07-02 LAB — GLUCOSE, POCT (MANUAL RESULT ENTRY): POC GLUCOSE: 158 mg/dL — AB (ref 70–99)

## 2017-07-02 LAB — POCT GLYCOSYLATED HEMOGLOBIN (HGB A1C): Hemoglobin A1C: 7.6

## 2017-07-02 MED ORDER — TERBINAFINE HCL 1 % EX CREA: 1.0000 "application " | TOPICAL_CREAM | Freq: Two times a day (BID) | CUTANEOUS | 0 refills | Status: DC

## 2017-07-02 MED ORDER — METFORMIN HCL ER 500 MG PO TB24: 1000.0000 mg | ORAL_TABLET | Freq: Two times a day (BID) | ORAL | 3 refills | Status: DC

## 2017-07-02 MED ORDER — SITAGLIPTIN PHOSPHATE 25 MG PO TABS: 25.0000 mg | ORAL_TABLET | Freq: Every day | ORAL | 5 refills | Status: DC

## 2017-07-02 MED FILL — JANUVIA 25 MG TABLET: 25 | 30 days supply | Qty: 30 | Fill #0

## 2017-07-02 MED FILL — METFORMIN HCL ER 500 MG TAB: 500 | 30 days supply | Qty: 120 | Fill #0

## 2017-07-02 NOTE — Progress Notes (Signed)
Needs letter clearing him to have dental exam.

## 2017-07-02 NOTE — Progress Notes (Signed)
Subjective:  Patient ID: Scott Mcclain, male    DOB: [birth year]  Age: 42 y.o. MRN: [id number]  CC: Letter for School/Work   HPI Scott Mcclain presents for letter for  upcoming dental procedure. He reports upcoming dental appointment in early February with [facility] for routine dental cleaning/check up. He is requesting letter for clearance. 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 He denies any bloody stools or hematuria.   Diabetes  Disease Monitoring  Blood Sugar Ranges: 120's-130's  Polyuria: no   Visual problems: no   Medication Compliance: yes  Medication Side Effects  Hypoglycemia: no   Preventitive Health Care  Eye Exam:   Foot Exam: yes  Diet pattern: regular  Exercise: no   Outpatient Medications Prior to Visit  Medication Sig Dispense Refill  . ACCU-CHEK SOFTCLIX LANCETS lancets Use as instructed 100 each 12  . atorvastatin (LIPITOR) 80 MG tablet Take 1 tablet (80 mg total) by mouth every evening. 90 tablet 3  . Blood Glucose Monitoring Suppl (ACCU-CHEK AVIVA PLUS) w/Device KIT Use as directed 1 kit 0  . [facility] ASPIRIN LOW DOSE 81 MG EC tablet TAKE 1 TABLET (81 MG TOTAL) BY MOUTH DAILY. 90 tablet 1  . glucose blood (ACCU-CHEK AVIVA PLUS) test strip Use as instructed 100 each 12  . metoprolol tartrate (LOPRESSOR) 25 MG tablet Take 1 tablet (25 mg total) by mouth 2 (two) times daily. 180 tablet 3  . ticagrelor (BRILINTA) 90 MG TABS tablet Take 1 tablet (90 mg total) by mouth 2 (two) times daily. 180 tablet 3  . metFORMIN (GLUCOPHAGE-XR) 500 MG 24 hr tablet Take 2 tablets (1,000 mg total) by mouth 2 (two) times daily with a meal. 180 tablet 1  . nitroGLYCERIN (NITROSTAT) 0.4 MG SL tablet Place 1 tablet (0.4 mg total) under the tongue every 5 (five) minutes as needed for chest pain (up to 3 doses). (Patient not taking: Reported on 07/02/2017) 25 tablet 3   Facility-Administered Medications Prior to Visit  Medication Dose  Route Frequency Provider Last Rate Last Dose  . insulin aspart (novoLOG) injection 10 Units  10 Units Subcutaneous Once Serenitie Vinton R, FNP        ROS Review of Systems  Constitutional: Negative.   Respiratory: Negative.   Cardiovascular: Negative.   Endocrine: Negative.   Skin: Negative.   Neurological: Negative.   Psychiatric/Behavioral: Negative for suicidal ideas.    Objective:  BP 108/69 (BP Location: Left Arm, Patient Position: Sitting, Cuff Size: Normal)   Pulse 68   Temp 98.2 F (36.8 C) (Oral)   Resp 12   Wt 151 lb 12.8 oz (68.9 kg)   SpO2 100%   BMI 24.50 kg/m   BP/Weight 07/02/2017 03/06/2017 5/46/5681  Systolic BP 275 170 017  Diastolic BP 69 70 73  Wt. (Lbs) 151.8 - -  BMI 24.5 - -     Physical Exam  Constitutional: He appears well-developed and well-nourished.  HENT:  Mouth/Throat: Uvula is midline, oropharynx is clear and moist and mucous membranes are normal.  Eyes: Conjunctivae are normal. Pupils are equal, round, and reactive to light.  Cardiovascular: Normal rate, regular rhythm, normal heart sounds and intact distal pulses.  Pulmonary/Chest: Effort normal and breath sounds normal.  Abdominal: Soft. Bowel sounds are normal. There is no tenderness.  Skin: Skin is warm and dry.  Psychiatric: His affect is not inappropriate. He expresses no homicidal and no suicidal ideation. He expresses no suicidal  plans and no homicidal plans. He is communicative. He is attentive.  Nursing note and vitals reviewed.    Assessment & Plan:   1. Uncontrolled type 2 diabetes mellitus with hyperglycemia ([place]) Encouraged more attention to diet. Januvia added for better glucose control.  Follow up in 1 month with PCP. Patient informed of paperwork policy. Letter will be provided.  - Glucose (CBG) - HgB A1c - metFORMIN (GLUCOPHAGE-XR) 500 MG 24 hr tablet; Take 2 tablets (1,000 mg total) by mouth 2 (two) times daily with a meal.  Dispense: 180 tablet; Refill: 3 -  sitaGLIPtin (JANUVIA) 25 MG tablet; Take 1 tablet (25 mg total) by mouth daily.  Dispense: 30 tablet; Refill: 5  2. Tinea pedis of both feet  - terbinafine (LAMISIL AT) 1 % cream; Apply 1 application topically 2 (two) times daily. For 14 days.  Dispense: 30 g; Refill: 0     Follow-up: Return in about 1 month (around 08/02/2017) for DM .   Alfonse Spruce FNP

## 2017-07-02 NOTE — Patient Instructions (Addendum)
You will be called with letter is ready.  Bring glucometer or log to next office visit. Diabetes Mellitus and Nutrition When you have diabetes (diabetes mellitus), it is very important to have healthy eating habits because your blood sugar (glucose) levels are greatly affected by what you eat and drink. Eating healthy foods in the appropriate amounts, at about the same times every day, can help you:  Control your blood glucose.  Lower your risk of heart disease.  Improve your blood pressure.  Reach or maintain a healthy weight.  Every person with diabetes is different, and each person has different needs for a meal plan. Your health care provider may recommend that you work with a diet and nutrition specialist (dietitian) to make a meal plan that is best for you. Your meal plan may vary depending on factors such as:  The calories you need.  The medicines you take.  Your weight.  Your blood glucose, blood pressure, and cholesterol levels.  Your activity level.  Other health conditions you have, such as heart or kidney disease.  How do carbohydrates affect me? Carbohydrates affect your blood glucose level more than any other type of food. Eating carbohydrates naturally increases the amount of glucose in your blood. Carbohydrate counting is a method for keeping track of how many carbohydrates you eat. Counting carbohydrates is important to keep your blood glucose at a healthy level, especially if you use insulin or take certain oral diabetes medicines. It is important to know how many carbohydrates you can safely have in each meal. This is different for every person. Your dietitian can help you calculate how many carbohydrates you should have at each meal and for snack. Foods that contain carbohydrates include:  Bread, cereal, rice, pasta, and crackers.  Potatoes and corn.  Peas, beans, and lentils.  Milk and yogurt.  Fruit and juice.  Desserts, such as cakes, cookies, ice  cream, and candy.  How does alcohol affect me? Alcohol can cause a sudden decrease in blood glucose (hypoglycemia), especially if you use insulin or take certain oral diabetes medicines. Hypoglycemia can be a life-threatening condition. Symptoms of hypoglycemia (sleepiness, dizziness, and confusion) are similar to symptoms of having too much alcohol. If your health care provider says that alcohol is safe for you, follow these guidelines:  Limit alcohol intake to no more than 1 drink per day for nonpregnant women and 2 drinks per day for men. One drink equals 12 oz of beer, 5 oz of wine, or 1 oz of hard liquor.  Do not drink on an empty stomach.  Keep yourself hydrated with water, diet soda, or unsweetened iced tea.  Keep in mind that regular soda, juice, and other mixers may contain a lot of sugar and must be counted as carbohydrates.  What are tips for following this plan? Reading food labels  Start by checking the serving size on the label. The amount of calories, carbohydrates, fats, and other nutrients listed on the label are based on one serving of the food. Many foods contain more than one serving per package.  Check the total grams (g) of carbohydrates in one serving. You can calculate the number of servings of carbohydrates in one serving by dividing the total carbohydrates by 15. For example, if a food has 30 g of total carbohydrates, it would be equal to 2 servings of carbohydrates.  Check the number of grams (g) of saturated and trans fats in one serving. Choose foods that have low or no amount  of these fats.  Check the number of milligrams (mg) of sodium in one serving. Most people should limit total sodium intake to less than 2,300 mg per day.  Always check the nutrition information of foods labeled as "low-fat" or "nonfat". These foods may be higher in added sugar or refined carbohydrates and should be avoided.  Talk to your dietitian to identify your daily goals for  nutrients listed on the label. Shopping  Avoid buying canned, premade, or processed foods. These foods tend to be high in fat, sodium, and added sugar.  Shop around the outside edge of the grocery store. This includes fresh fruits and vegetables, bulk grains, fresh meats, and fresh dairy. Cooking  Use low-heat cooking methods, such as baking, instead of high-heat cooking methods like deep frying.  Cook using healthy oils, such as olive, canola, or sunflower oil.  Avoid cooking with butter, cream, or high-fat meats. Meal planning  Eat meals and snacks regularly, preferably at the same times every day. Avoid going long periods of time without eating.  Eat foods high in fiber, such as fresh fruits, vegetables, beans, and whole grains. Talk to your dietitian about how many servings of carbohydrates you can eat at each meal.  Eat 4-6 ounces of lean protein each day, such as lean meat, chicken, fish, eggs, or tofu. 1 ounce is equal to 1 ounce of meat, chicken, or fish, 1 egg, or 1/4 cup of tofu.  Eat some foods each day that contain healthy fats, such as avocado, nuts, seeds, and fish. Lifestyle   Check your blood glucose regularly.  Exercise at least 30 minutes 5 or more days each week, or as told by your health care provider.  Take medicines as told by your health care provider.  Do not use any products that contain nicotine or tobacco, such as cigarettes and e-cigarettes. If you need help quitting, ask your health care provider.  Work with a Veterinary surgeoncounselor or diabetes educator to identify strategies to manage stress and any emotional and social challenges. What are some questions to ask my health care provider?  Do I need to meet with a diabetes educator?  Do I need to meet with a dietitian?  What number can I call if I have questions?  When are the best times to check my blood glucose? Where to find more information:  American Diabetes Association:  diabetes.org/food-and-fitness/food  Academy of Nutrition and Dietetics: https://www.vargas.com/www.eatright.org/resources/health/diseases-and-conditions/diabetes  [employer] Institute of Diabetes and Digestive and Kidney Diseases (NIH): FindJewelers.czwww.niddk.nih.gov/health-information/diabetes/overview/diet-eating-physical-activity Summary  A healthy meal plan will help you control your blood glucose and maintain a healthy lifestyle.  Working with a diet and nutrition specialist (dietitian) can help you make a meal plan that is best for you.  Keep in mind that carbohydrates and alcohol have immediate effects on your blood glucose levels. It is important to count carbohydrates and to use alcohol carefully. This information is not intended to replace advice given to you by your health care provider. Make sure you discuss any questions you have with your health care provider. Document Released: 02/16/2005 Document Revised: 06/26/2016 Document Reviewed: 06/26/2016 Elsevier Interactive Patient Education  2018 [employer]   Pie de atleta (Athlete's Foot) El pie de atleta (tinea pedis) es una infeccin por hongos en la piel de los pies. Generalmente se produce en la piel que se encuentra entre los dedos o debajo de ellos. Tambin puede aparecer en la planta de los pies. La infeccin puede transmitirse de Neomia Dearuna persona a otra (es contagiosa).  CUIDADOS EN EL HOGAR  Aplquese o tome los medicamentos de venta libre y los recetados solamente como se lo haya indicado el mdico.  Oceanographer a todas las visitas de control como se lo haya indicado el mdico. Esto es importante.  No se rasque los pies.  Mantenga los pies secos: ? Use calcetines de algodn o lana. Cmbiese los calcetines [employer] o si se mojan. ? Use un tipo de calzado que permita que el aire [employer], como sandalias o zapatillas de lona.  Lvese y squese los pies: ? [employer] o como se lo haya indicado el mdico. ? Despus de [employer] fsica. ? Sin  olvidar la zona [employer] dedos.  Use sandalias cuando tenga que pisar zonas hmedas, como vestuarios y duchas compartidas.  No comparta ninguno de los siguientes objetos: ? Toallas. ? Alicates para las uas. ? Otros objetos de uso personal que tengan contacto con sus pies.  Si tiene diabetes, mantenga bajo control el nivel de Banker. SOLICITE AYUDA SI:  Tiene fiebre.  Aumenta la hinchazn, el dolor, el calor o el enrojecimiento en el pie.  No mejora con el tratamiento.  Los sntomas empeoran.  Aparecen nuevos sntomas. Esta informacin no tiene Theme park manager el consejo del mdico. Asegrese de hacerle al mdico cualquier pregunta que tenga. Document Released: 06/24/2010 Document Revised: 09/13/2015 Document Reviewed: 11/23/2014 Elsevier Interactive Patient Education  [employer].

## 2017-07-03 MED FILL — ACCU-CHEK AVIVA PLUS TEST S: 30 days supply | Qty: 100 | Fill #1

## 2017-07-10 ENCOUNTER — Other Ambulatory Visit: Payer: Self-pay | Admitting: Family Medicine

## 2017-07-11 ENCOUNTER — Telehealth: Payer: Self-pay | Admitting: Family Medicine

## 2017-07-11 MED FILL — ATORVASTATIN 80 MG TABLET: 80 | 30 days supply | Qty: 30 | Fill #4

## 2017-07-11 NOTE — Telephone Encounter (Signed)
Spoke with patient to inform him that his letter is ready for pick up he informed me that he will pick it up tomorrow

## 2017-07-11 NOTE — Telephone Encounter (Signed)
-----   Message from Lizbeth BarkMandesia R Hairston, FNP sent at 07/10/2017  6:02 PM EST ----- Regarding: Letter for Pick Up Hello,  Patient requested letter for dental clearance. Letter will be placed in my mailbox and will be available for pick up tomorrow.  Sincerely,  Arrie SenateMandesia Hairston FNP-BC

## 2017-07-13 MED FILL — METOPROLOL TARTRATE 25 MG T: 25 | 30 days supply | Qty: 60 | Fill #5

## 2017-07-13 MED FILL — BRILINTA 90 MG TABLET: 90 | 30 days supply | Qty: 60 | Fill #5

## 2017-07-13 MED FILL — ACCU-CHEK SOFTCLIX LANCETS: 25 days supply | Qty: 100 | Fill #1

## 2017-08-13 MED FILL — ACCU-CHEK AVIVA PLUS TEST S: 30 days supply | Qty: 100 | Fill #2

## 2017-08-20 ENCOUNTER — Ambulatory Visit: Payer: Self-pay | Attending: Nurse Practitioner | Admitting: Nurse Practitioner

## 2017-08-20 ENCOUNTER — Encounter: Payer: Self-pay | Admitting: Nurse Practitioner

## 2017-08-20 VITALS — BP 102/68 | HR 64 | Temp 98.1°F | Ht 67.0 in | Wt 153.0 lb

## 2017-08-20 DIAGNOSIS — Z79899 Other long term (current) drug therapy: Secondary | ICD-10-CM | POA: Insufficient documentation

## 2017-08-20 DIAGNOSIS — E08641 Diabetes mellitus due to underlying condition with hypoglycemia with coma: Secondary | ICD-10-CM

## 2017-08-20 DIAGNOSIS — IMO0001 Reserved for inherently not codable concepts without codable children: Secondary | ICD-10-CM

## 2017-08-20 DIAGNOSIS — Z955 Presence of coronary angioplasty implant and graft: Secondary | ICD-10-CM | POA: Insufficient documentation

## 2017-08-20 DIAGNOSIS — E11649 Type 2 diabetes mellitus with hypoglycemia without coma: Secondary | ICD-10-CM | POA: Insufficient documentation

## 2017-08-20 DIAGNOSIS — Z760 Encounter for issue of repeat prescription: Secondary | ICD-10-CM | POA: Insufficient documentation

## 2017-08-20 DIAGNOSIS — Z794 Long term (current) use of insulin: Secondary | ICD-10-CM | POA: Insufficient documentation

## 2017-08-20 DIAGNOSIS — E782 Mixed hyperlipidemia: Secondary | ICD-10-CM | POA: Insufficient documentation

## 2017-08-20 DIAGNOSIS — I252 Old myocardial infarction: Secondary | ICD-10-CM | POA: Insufficient documentation

## 2017-08-20 DIAGNOSIS — E1165 Type 2 diabetes mellitus with hyperglycemia: Secondary | ICD-10-CM | POA: Insufficient documentation

## 2017-08-20 DIAGNOSIS — I10 Essential (primary) hypertension: Secondary | ICD-10-CM | POA: Insufficient documentation

## 2017-08-20 DIAGNOSIS — I2510 Atherosclerotic heart disease of native coronary artery without angina pectoris: Secondary | ICD-10-CM | POA: Insufficient documentation

## 2017-08-20 LAB — GLUCOSE, POCT (MANUAL RESULT ENTRY): POC GLUCOSE: 194 mg/dL — AB (ref 70–99)

## 2017-08-20 MED ORDER — SITAGLIPTIN PHOSPHATE 25 MG PO TABS: 25.0000 mg | ORAL_TABLET | Freq: Every day | ORAL | 5 refills | Status: DC

## 2017-08-20 MED ORDER — ATORVASTATIN CALCIUM 80 MG PO TABS
80.0000 mg | ORAL_TABLET | Freq: Every evening | ORAL | 3 refills | Status: DC
Start: 2017-08-20 — End: 2019-09-19

## 2017-08-20 MED ORDER — METOPROLOL SUCCINATE ER 50 MG PO TB24
50.0000 mg | ORAL_TABLET | Freq: Every day | ORAL | 3 refills | Status: DC
Start: 2017-08-20 — End: 2017-12-02

## 2017-08-20 MED ORDER — ACCU-CHEK SOFTCLIX LANCETS MISC: 12 refills | Status: DC

## 2017-08-20 MED FILL — ACCU-CHEK SOFTCLIX LANCETS: 30 days supply | Qty: 100 | Fill #0

## 2017-08-20 MED FILL — ATORVASTATIN 80 MG TABLET: 80 | 30 days supply | Qty: 30 | Fill #0

## 2017-08-20 MED FILL — JANUVIA 25 MG TABLET: 25 | 30 days supply | Qty: 30 | Fill #0

## 2017-08-20 MED FILL — METOPROLOL SUCCINATE ER 50: 50 | 30 days supply | Qty: 30 | Fill #0

## 2017-08-20 NOTE — Progress Notes (Signed)
Assessment & Plan:  Scott Mcclain was seen today for establish care and medication refill.  Diagnoses and all orders for this visit:  Uncontrolled type 2 diabetes mellitus without complication, without long-term current use of insulin (HCC) -     Glucose (CBG) -     sitaGLIPtin (JANUVIA) 25 MG tablet; Take 1 tablet (25 mg total) by mouth daily. -     ACCU-CHEK SOFTCLIX LANCETS lancets; Use as instructed Continue blood sugar control as discussed in office today, low carbohydrate diet, and regular physical exercise as tolerated, 150 minutes per week (30 min each day, 5 days per week, or 50 min 3 days per week). Keep blood sugar logs with fasting goal of 80-130 mg/dl, post prandial less than 180.  For Hypoglycemia: BS <60 and Hyperglycemia BS >400; contact the clinic ASAP. Annual eye exams and foot exams are recommended.  Essential hypertension -     metoprolol succinate (TOPROL-XL) 50 MG 24 hr tablet; Take 1 tablet (50 mg total) by mouth daily. Take with or immediately following a meal. Continue all antihypertensives as prescribed.  Remember to bring in your blood pressure log with you for your follow up appointment.  DASH/Mediterranean Diets are healthier choices for HTN.   Mixed hyperlipidemia -     atorvastatin (LIPITOR) 80 MG tablet; Take 1 tablet (80 mg total) by mouth every evening.  No fried foods. No junk foods, sodas, sugary drinks, unhealthy snacking, or smoking.    Patient has been counseled on age-appropriate routine health concerns for screening and prevention. These are reviewed and up-to-date. Referrals have been placed accordingly. Immunizations are up-to-date or declined.    Subjective:   Chief Complaint  Patient presents with  . Establish Care    Pt's is here for diabetes, hypertension, and cholesterol.   . Medication Refill   HPI Scott Mcclain 42 y.o. male presents to office today to establish care.  He works at [employer] as a Training and development officer and endorses increased  stress on his job. History of NSTEMI with stent placement 08-2016. Currently taking Brilinta as prescribed.   Diabetes Mellitus Type 2 Checks CBGs BID: Fasting 120-150. Post prandial 170-220. Endorses episodes of lightheadedness. Denies any visual disturbances. Not taking medication as prescribed. Takes metformin '500mg'$  daily and sometimes BID depending on what he eats or how he feels. Taking januvia 3-4 times per week. He is aware that he should be taking his medication as prescribed. Lab Results  Component Value Date   HGBA1C 7.6 07/02/2017   Essential Hypertension Chronic. Stable. Endorses medication compliance taking metoprolol tartrate '25mg'$  BID however he reports intermittent episodes of lightheadedness. He does not have a blood pressure monitor at home to check his blood pressure. When he feels lightheaded he checks his blood sugar and it is normal. Symptoms may be related to hypotension. I have recommended that he obtain a home blood pressure monitor so that he can also check his blood pressure. Will switch to toprol xl once a day to hopefully improve symptoms of lightheadeness and recheck blood pressure in 2 weeks. Denies chest pain, shortness of breath, palpitations, visual disturbance,  headaches or BLE edema.  BP Readings from Last 3 Encounters:  08/20/17 102/68  07/02/17 108/69  03/06/17 109/70    Hyperlipidemia Patient presents for follow up to hyperlipidemia.  He is medication adherent and denies chest pain, exertional chest pressure/discomfort, fatigue and lower extremity edema or statin intolerance including myalgias.  Lab Results  Component Value Date   CHOL 135 11/20/2016  CHOL 187 09/01/2016   CHOL 189 08/31/2016   Lab Results  Component Value Date   HDL 47 11/20/2016   HDL 51 09/01/2016   HDL 50 08/31/2016   Lab Results  Component Value Date   LDLCALC 67 11/20/2016   LDLCALC 118 (H) 09/01/2016   LDLCALC 125 (H) 08/31/2016   Lab Results  Component Value Date    TRIG 104 11/20/2016   TRIG 89 09/01/2016   TRIG 72 08/31/2016   Lab Results  Component Value Date   CHOLHDL 2.9 11/20/2016   CHOLHDL 3.7 09/01/2016   CHOLHDL 3.8 08/31/2016   Review of Systems  Constitutional: Negative for fever, malaise/fatigue and weight loss.  HENT: Negative.  Negative for nosebleeds.   Eyes: Negative.  Negative for blurred vision, double vision and photophobia.  Respiratory: Negative.  Negative for cough and shortness of breath.   Cardiovascular: Negative.  Negative for chest pain, palpitations and leg swelling.  Gastrointestinal: Negative.  Negative for heartburn, nausea and vomiting.  Musculoskeletal: Negative.  Negative for myalgias.  Neurological: Positive for dizziness. Negative for focal weakness, seizures and headaches.  Psychiatric/Behavioral: Negative.  Negative for suicidal ideas.    Past Medical History:  Diagnosis Date  . CAD in native artery    a. Inf STEMI 08/2016: [facility] 08/31/16 showed 100% prox RCA, 75% OM1, mild LV dysfunction EF 45-50%, normal LVEDP -> s/p successful stenting of prox-mid RCA with DES, consider PCI of OM1 if recurrent angina. EF 55% by f/u echo same admission.  . Hyperlipidemia   . Hypertension   . Uncontrolled diabetes mellitus ([place])     Past Surgical History:  Procedure Laterality Date  . CORONARY STENT INTERVENTION N/A 08/31/2016   Procedure: Coronary Stent Intervention;  Surgeon: Peter M Martinique, MD;  Location: [facility];  Service: Cardiovascular;  Laterality: N/A;  . LEFT HEART CATH AND CORONARY ANGIOGRAPHY N/A 08/31/2016   Procedure: Left Heart Cath and Coronary Angiography;  Surgeon: Peter M Martinique, MD;  Location: [facility];  Service: Cardiovascular;  Laterality: N/A;    Family History  Problem Relation Age of Onset  . Diabetes Mother   . Heart attack Father     Social History Reviewed with no changes to be made today.   Outpatient Medications Prior to Visit  Medication Sig Dispense Refill  .  Blood Glucose Monitoring Suppl (ACCU-CHEK AVIVA PLUS) w/Device KIT Use as directed 1 kit 0  . [facility] ASPIRIN LOW DOSE 81 MG EC tablet TAKE 1 TABLET (81 MG TOTAL) BY MOUTH DAILY. 90 tablet 1  . glucose blood (ACCU-CHEK AVIVA PLUS) test strip Use as instructed 100 each 12  . metFORMIN (GLUCOPHAGE-XR) 500 MG 24 hr tablet Take 2 tablets (1,000 mg total) by mouth 2 (two) times daily with a meal. 180 tablet 3  . ticagrelor (BRILINTA) 90 MG TABS tablet Take 1 tablet (90 mg total) by mouth 2 (two) times daily. 180 tablet 3  . ACCU-CHEK SOFTCLIX LANCETS lancets Use as instructed 100 each 12  . atorvastatin (LIPITOR) 80 MG tablet Take 1 tablet (80 mg total) by mouth every evening. 90 tablet 3  . metoprolol tartrate (LOPRESSOR) 25 MG tablet Take 1 tablet (25 mg total) by mouth 2 (two) times daily. 180 tablet 3  . sitaGLIPtin (JANUVIA) 25 MG tablet Take 1 tablet (25 mg total) by mouth daily. 30 tablet 5  . nitroGLYCERIN (NITROSTAT) 0.4 MG SL tablet Place 1 tablet (0.4 mg total) under the tongue every 5 (five) minutes as needed  for chest pain (up to 3 doses). (Patient not taking: Reported on 07/02/2017) 25 tablet 3  . terbinafine (LAMISIL AT) 1 % cream Apply 1 application topically 2 (two) times daily. For 14 days. (Patient not taking: Reported on 08/20/2017) 30 g 0   Facility-Administered Medications Prior to Visit  Medication Dose Route Frequency Provider Last Rate Last Dose  . insulin aspart (novoLOG) injection 10 Units  10 Units Subcutaneous Once Hairston, Mandesia R, FNP        No Known Allergies     Objective:    BP 102/68 (BP Location: Right Arm, Patient Position: Sitting, Cuff Size: Normal)   Pulse 64   Temp 98.1 F (36.7 C) (Oral)   Ht '5\' 7"'$  (1.702 m)   Wt 153 lb (69.4 kg)   SpO2 99%   BMI 23.96 kg/m  Wt Readings from Last 3 Encounters:  08/20/17 153 lb (69.4 kg)  07/02/17 151 lb 12.8 oz (68.9 kg)  01/05/17 156 lb (70.8 kg)    Physical Exam  Constitutional: He is oriented to person,  place, and time. He appears well-developed and well-nourished. He is cooperative.  HENT:  Head: Normocephalic and atraumatic.  Eyes: EOM are normal.  Neck: Normal range of motion.  Cardiovascular: Normal rate, regular rhythm and normal heart sounds. Exam reveals no gallop and no friction rub.  No murmur heard. Pulmonary/Chest: Effort normal and breath sounds normal. No tachypnea. No respiratory distress. He has no decreased breath sounds. He has no wheezes. He has no rhonchi. He has no rales. He exhibits no tenderness.  Abdominal: Soft. Bowel sounds are normal.  Musculoskeletal: Normal range of motion. He exhibits no edema, tenderness or deformity.  Neurological: He is alert and oriented to person, place, and time. Coordination and gait normal.  Skin: Skin is warm and dry.  Psychiatric: He has a normal mood and affect. His behavior is normal. Judgment and thought content normal.  Nursing note and vitals reviewed.      Patient has been counseled extensively about nutrition and exercise as well as the importance of adherence with medications and regular follow-up. The patient was given clear instructions to go to ER or return to medical center if symptoms don't improve, worsen or new problems develop. The patient verbalized understanding.   Follow-up: Return in about 2 weeks (around 09/03/2017) for NURSE VISIT FOR BP RECHECK.   Gildardo Pounds, FNP-BC [facility] [place], [place]   08/20/2017, 4:13 PM

## 2017-08-20 NOTE — Patient Instructions (Addendum)
Diabetes mellitus y nutricin Diabetes Mellitus and Nutrition Si sufre de diabetes (diabetes mellitus), es muy importante tener hbitos alimenticios saludables debido a que sus niveles de azcar en la sangre (glucosa) se ven afectados en gran medida por lo que come y bebe. Comer alimentos saludables en las cantidades adecuadas, aproximadamente a la misma hora todos los das, lo ayudar a:  Controlar la glucemia.  Disminuir el riesgo de sufrir una enfermedad cardaca.  Mejorar la presin arterial.  Alcanzar o mantener un peso saludable.  Todas las personas que sufren de diabetes son diferentes y cada una tiene necesidades diferentes en cuanto a un plan de alimentacin. El mdico puede recomendarle que trabaje con un especialista en dietas y nutricin (nutricionista) para elaborar el mejor plan para usted. Su plan de alimentacin puede variar segn factores como:  Las caloras que necesita.  Los medicamentos que toma.  Su peso.  Sus niveles de glucemia, presin arterial y colesterol.  Su nivel de actividad.  Otras afecciones que tenga, como enfermedades cardacas o renales.  Cmo me afectan los carbohidratos? Los carbohidratos afectan el nivel de glucemia ms que cualquier otro tipo de alimento. La ingesta de carbohidratos naturalmente aumenta la cantidad glucosa en la sangre. El recuento de carbohidratos es un mtodo destinado a llevar un registro de la cantidad de carbohidratos que se ingieren. El recuento de carbohidratos es importante para mantener la glucemia a un nivel saludable, en especial si utiliza insulina o toma determinados medicamentos por va oral para la diabetes. Es importante saber la cantidad de carbohidratos que se pueden ingerir en cada comida sin correr ningn riesgo. Esto es diferente en cada persona. El nutricionista puede ayudarlo a calcular la cantidad de carbohidratos que debe ingerir en cada comida y colacin. Los alimentos que contienen carbohidratos  incluyen:  Pan, cereal, arroz, pasta y galletas.  Papas y maz.  Guisantes, frijoles y lentejas.  Leche y yogur.  Frutas y jugo.  Postres, como pasteles, galletitas, helado y caramelos.  Cmo me afecta el alcohol? El alcohol puede provocar disminuciones sbitas de la glucemia (hipoglucemia), en especial si utiliza insulina o toma determinados medicamentos por va oral para la diabetes. La hipoglucemia es una afeccin potencialmente mortal. Los sntomas de la hipoglucemia (somnolencia, mareos y confusin) son similares a los sntomas de haber consumido demasiado alcohol. Si el mdico afirma que el alcohol es seguro para usted, siga estas pautas:  Limite el consumo de alcohol a no ms de 1 medida por da si es mujer y no est embarazada, y a 2 medidas si es hombre. Una medida equivale a 12oz (355ml) de cerveza, 5oz (148ml) de vino o 1oz (44ml) de bebidas de alta graduacin alcohlica.  No beba con el estmago vaco.  Mantngase hidratado con agua, gaseosas dietticas o t helado sin azcar.  Tenga en cuenta que las gaseosas comunes, los jugos y otros refrescos pueden contener mucha azcar y se deben contar como carbohidratos.  Consejos para seguir este plan Leer las etiquetas de los alimentos  Comience por controlar el tamao de la porcin en la etiqueta. La cantidad de caloras, carbohidratos, grasas y otros nutrientes mencionados en la etiqueta se basan en una porcin del alimento. Muchos alimentos contienen ms de una porcin por envase.  Verifique la cantidad total de gramos (g) de carbohidratos totales en una porcin. Puede calcular la cantidad de porciones de carbohidratos al dividir el total de carbohidratos por 15. Por ejemplo, si un alimento posee un total de 30g de carbohidratos, equivale a 2 porciones   de carbohidratos.  Verifique la cantidad de gramos (g) de grasas saturadas y grasas trans en una porcin. Escoja alimentos que no contengan grasa o que tengan un bajo  contenido.  Controle la cantidad de miligramos (mg) de sodio en una porcin. La mayora de las personas deben limitar la ingesta de sodio total a menos de 2300mg por da.  Siempre consulte la informacin nutricional de los alimentos etiquetados como "con bajo contenido de grasa" o "sin grasa". Estos alimentos pueden ser ms altos en azcar agregada o en carbohidratos refinados y deben evitarse.  Hable con el nutricionista para identificar sus objetivos diarios en cuanto a los nutrientes mencionados en la etiqueta. De compras  Evite comprar alimentos procesados, enlatados o prehechos. Estos alimentos tienden a tener mayor cantidad de grasa, sodio y azcar agregada.  Compre en la zona exterior de la tienda de comestibles. Esta incluye frutas y vegetales frescos, granos a granel, carnes frescas y productos lcteos frescos. Coccin  Utilice mtodos de coccin a baja temperatura, como hornear, en lugar de mtodos de coccin a alta temperatura, como frer en abundante aceite.  Cocine con aceites saludables, como el aceite de oliva, canola o girasol.  Evite cocinar con manteca, crema o carnes con alto contenido de grasa. Planificacin de las comidas  Consuma las comidas y las colaciones de forma regular, preferentemente a la misma hora todos los das. Evite pasar largos perodos de tiempo sin comer.  Consuma alimentos ricos en fibra, como frutas frescas, verduras, frijoles y cereales integrales. Consulte al nutricionista sobre cuntas porciones de carbohidratos puede consumir en cada comida.  Consuma entre 4 y 6 onzas de protenas magras por da, como carnes magras, pollo, pescado, huevos o tofu. 1 onza equivale a 1 onza de carne, pollo o pescado, 1 huevo, o 1/4 taza de tofu.  Coma algunos alimentos por da que contengan grasas saludables, como aguacates, frutos secos, semillas y pescado. Estilo de vida   Controle su nivel de glucemia con regularidad.  Haga ejercicio al menos 30minutos,  5das o ms por semana, o como se lo haya indicado el mdico.  Tome los medicamentos como se lo haya indicado el mdico.  No consuma ningn producto que contenga nicotina o tabaco, como cigarrillos y cigarrillos electrnicos. Si necesita ayuda para dejar de fumar, consulte al mdico.  Trabaje con un asesor o instructor en diabetes para identificar estrategias para controlar el estrs y cualquier desafo emocional y social. Cules son algunas de las preguntas que puedo hacerle a mi mdico?  Es necesario que me rena con un instructor en diabetes?  Es necesario que me rena con un nutricionista?  A qu nmero puedo llamar si tengo preguntas?  Cules son los mejores momentos para controlar la glucemia? Dnde encontrar ms informacin:  Asociacin Americana de la Diabetes (American Diabetes Association): diabetes.org/food-and-fitness/food  Academia de Nutricin y Diettica (Academy of Nutrition and Dietetics): www.eatright.org/resources/health/diseases-and-conditions/diabetes  Instituto Nacional de la Diabetes y las Enfermedades Digestivas y Renales (National Institute of Diabetes and Digestive and Kidney Diseases) (Institutos Nacionales de Salud, NIH): www.niddk.nih.gov/health-information/diabetes/overview/diet-eating-physical-activity Resumen  Un plan de alimentacin saludable lo ayudar a controlar la glucemia y mantener un estilo de vida saludable.  Trabajar con un especialista en dietas y nutricin (nutricionista) puede ayudarlo a elaborar el mejor plan de alimentacin para usted.  Tenga en cuenta que los carbohidratos y el alcohol tienen efectos inmediatos en sus niveles de glucemia. Es importante contar los carbohidratos y consumir alcohol con prudencia. Esta informacin no tiene como fin reemplazar el consejo del   mdico. Asegrese de hacerle al mdico cualquier pregunta que tenga. Document Released: 08/29/2007 Document Revised: 09/11/2016 Document Reviewed:  09/11/2016 Elsevier Interactive Patient Education  2018 Elsevier Inc. Recuento de carbohidratos para la diabetes mellitus en los adultos Carbohydrate Counting for Diabetes Mellitus, Adult El recuento de carbohidratos es un mtodo destinado a llevar un registro de la cantidad de carbohidratos que se ingieren. La ingesta natural de carbohidratos aumenta la cantidad de azcar (glucosa) en la sangre. El recuento de la cantidad de carbohidratos que se ingieren sirve para que el nivel de glucosa sangunea (glucemia) permanezca dentro de los lmites normales, lo que ayuda a mantener la diabetes (diabetes mellitus) bajo control. Es importante saber la cantidad de carbohidratos que se pueden ingerir en cada comida sin correr ningn riesgo. Esto es diferente en cada persona. Un especialista en alimentacin y nutricin (nutricionista certificado) puede ayudarlo a crear un plan de alimentacin y a calcular la cantidad de carbohidratos que debe ingerir en cada comida y colacin. Los siguientes alimentos incluyen carbohidratos:  Granos, como panes y cereales.  Frijoles secos y productos con soja.  Verduras con almidn, como papas, guisantes y maz.  Frutas y jugos de frutas.  Leche y yogur.  Dulces y colaciones, como pasteles, galletas, caramelos, papas fritas y refrescos.  Cmo se calculan los carbohidratos? Hay dos maneras de calcular los carbohidratos de los alimentos. Puede usar cualquiera de los dos mtodos o una combinacin de ambos. Leer la etiqueta de informacin nutricional de los alimentos envasados La lista de informacin nutricional est incluida en las etiquetas de casi todas las bebidas y los alimentos envasados de los [place]. Incluye lo siguiente:  El tamao de la porcin.  Informacin sobre los nutrientes de cada porcin, incluidos los gramos (g) de carbohidratos por porcin.  Para usar la informacin nutricional:  Decida cuntas porciones va a comer.  Multiplique la  cantidad de porciones por el nmero de carbohidratos por porcin.  El resultado es la cantidad total de carbohidratos que comer.  Conocer los tamaos de las porciones estndar de otros alimentos Cuando coma alimentos que contienen carbohidratos que no estn envasados o no incluyen la informacin nutricional en la etiqueta, debe medir las porciones para poder calcular la cantidad de carbohidratos:  Mida los alimentos que comer con una balanza de alimentos o una taza medidora, si es necesario.  Decida cuntas porciones de tamao estndar comer.  Multiplique el nmero de porciones por15. La mayora de los alimentos con alto contenido de carbohidratos contienen unos 15g de carbohidratos por porcin. ? Por ejemplo, si come 8onzas (170g) de fresas, habr comido 2porciones y 30g de carbohidratos (2porciones x 15g=30g).  En el caso de las comidas que contienen mezclas de ms de un alimento, como las sopas y los guisos, debe calcular los carbohidratos de cada alimento que se incluye.  La siguiente lista incluye los tamaos de las porciones estndar de los alimentos corrientes con alto contenido de carbohidratos. Cada una de estas porciones tiene aproximadamente 15g de carbohidratos:  pan de hamburguesa o muffin ingls.  onza (15ml) de almbar.  onza (14g) de gelatina.  1rebanada de pan.  1tortilla de seispulgadas.  3onzas (85g) de arroz o pasta cocidos.  4onzas (113g) de frijoles secos cocidos.  4onzas (113g) de verduras con almidn, como guisantes, maz o papas.  4onzas (113g) de cereal caliente.  4onzas (113g) de pur de papas o de una papa grande al horno.  4onzas (113g) de frutas en lata o congeladas.  4onzas (120ml) de jugo de   frutas.  4a 6galletas.  6croquetas de pollo.  6onzas (170g) de cereales secos sin azcar.  6onzas (170g) de yogur descremado sin ningn agregado o de yogur endulzado con edulcorante  artificial.  8onzas (240ml) de leche.  8onzas (170g) de frutas frescas o una fruta pequea.  24onzas (680g) de palomitas de maz.  Ejemplo de recuento de carbohidratos Ejemplo de comida  3onzas (85g) de pechuga de pollo.  6onzas (170g) de arroz integral.  4onzas (113g) de maz.  8onzas (240ml) de leche.  8onzas (170g) de fresas con crema batida sin azcar. Clculo de carbohidratos 1. Identifique los alimentos que contienen carbohidratos: ? Arroz. ? Maz. ? Leche. ? Fresas. 2. Calcule cuntas porciones come de cada alimento: ? 2 porciones de arroz. ? 1 porcin de maz. ? 1 porcin de leche. ? 1 porcin de fresas. 3. Multiplique cada nmero de porciones por 15g: ? 2 porciones de arroz x 15 g = 30 g. ? 1 porcin de maz x 15 g = 15 g. ? 1 porcin de leche x 15 g = 15 g. ? 1 porcin de fresas x 15 g = 15 g. 4. Sume todas las cantidades para conocer el total de gramos de carbohidratos consumidos: ? 30g + 15g + 15g + 15g = 75g de carbohidratos en total. Esta informacin no tiene como fin reemplazar el consejo del mdico. Asegrese de hacerle al mdico cualquier pregunta que tenga. Document Released: 08/14/2011 Document Revised: 05/09/2016 Document Reviewed: 11/03/2015 Elsevier Interactive Patient Education  2018 Elsevier Inc.  

## 2017-08-29 MED FILL — BRILINTA 90 MG TABLET: 90 | 30 days supply | Qty: 60 | Fill #6

## 2017-09-17 ENCOUNTER — Ambulatory Visit: Payer: Self-pay | Attending: Family Medicine | Admitting: *Deleted

## 2017-09-17 VITALS — BP 107/68 | HR 70

## 2017-09-17 DIAGNOSIS — I10 Essential (primary) hypertension: Secondary | ICD-10-CM

## 2017-09-17 DIAGNOSIS — Z0130 Encounter for examination of blood pressure without abnormal findings: Secondary | ICD-10-CM | POA: Insufficient documentation

## 2017-09-17 NOTE — Progress Notes (Signed)
[facility]cl[facility] and oriented in good spirits. He  is here for nurse visit: bp check. At last OV on 08/20/2017 with PCP, his blood pressure reading was 102/68.  Pt denies chest pain, SOB, HA, dizziness, or blurred vision. Verified medication with patient. Pt states medication was taken this morning.  Blood pressure reading: 107/68 Pulse: 70

## 2017-10-02 MED FILL — METOPROLOL SUCCINATE ER 50: 50 | 30 days supply | Qty: 30 | Fill #1

## 2017-10-02 MED FILL — BRILINTA 90 MG TABLET: 90 | 30 days supply | Qty: 60 | Fill #7

## 2017-10-02 MED FILL — ATORVASTATIN 80 MG TABLET: 80 | 30 days supply | Qty: 30 | Fill #1

## 2017-10-11 MED FILL — JANUVIA 25 MG TABLET: 25 | 30 days supply | Qty: 30 | Fill #1

## 2017-10-11 MED FILL — ACCU-CHEK SOFTCLIX LANCETS: 30 days supply | Qty: 100 | Fill #1

## 2017-10-30 MED FILL — METFORMIN HCL ER 500 MG TAB: 500 | 30 days supply | Qty: 120 | Fill #1

## 2017-10-30 MED FILL — METOPROLOL SUCCINATE ER 50: 50 | 30 days supply | Qty: 30 | Fill #2

## 2017-11-05 ENCOUNTER — Other Ambulatory Visit: Payer: Self-pay | Admitting: Cardiology

## 2017-11-06 NOTE — Telephone Encounter (Signed)
Rx sent to pharmacy   

## 2017-11-09 MED FILL — ATORVASTATIN 80 MG TABLET: 80 | 30 days supply | Qty: 30 | Fill #2

## 2017-11-21 MED FILL — METOPROLOL SUCCINATE ER 50: 50 | 30 days supply | Qty: 30 | Fill #3

## 2017-11-29 ENCOUNTER — Telehealth: Payer: Self-pay

## 2017-11-29 NOTE — Telephone Encounter (Signed)
Patient called asking for Provider can change his Metoprolol to 25 mg instead of 50mg .  Pt stated he would like to take one in the morning and one in the afternoon.

## 2017-12-02 ENCOUNTER — Other Ambulatory Visit: Payer: Self-pay | Admitting: Nurse Practitioner

## 2017-12-02 MED ORDER — METOPROLOL TARTRATE 25 MG PO TABS: 25.0000 mg | ORAL_TABLET | Freq: Two times a day (BID) | ORAL | 3 refills | Status: DC

## 2017-12-02 NOTE — Telephone Encounter (Signed)
Metoprolol has been switched from Toprol XL daily  to Metoprolol tartrate twice a day. It was sent to the community pharmacy

## 2017-12-03 MED FILL — METOPROLOL TARTRATE 25 MG T: 25 | 30 days supply | Qty: 60 | Fill #0

## 2017-12-03 NOTE — Telephone Encounter (Signed)
CMA called patient to inform his Rx has been changed [facility] Patient understood.

## 2017-12-04 MED FILL — BRILINTA 90 MG TABLET: 90 | 30 days supply | Qty: 60 | Fill #8

## 2017-12-04 MED FILL — ACCU-CHEK SOFTCLIX LANCETS: 30 days supply | Qty: 100 | Fill #2

## 2017-12-29 ENCOUNTER — Other Ambulatory Visit: Payer: Self-pay | Admitting: Cardiology

## 2018-01-02 MED FILL — ATORVASTATIN 80 MG TABLET: 80 | 30 days supply | Qty: 30 | Fill #3

## 2018-01-02 MED FILL — ACCU-CHEK AVIVA PLUS TEST S: 30 days supply | Qty: 100 | Fill #3

## 2018-01-14 MED FILL — METOPROLOL TARTRATE 25 MG T: 25 | 30 days supply | Qty: 60 | Fill #1

## 2018-01-14 MED FILL — METFORMIN HCL ER 500 MG TAB: 500 | 30 days supply | Qty: 120 | Fill #2

## 2018-01-14 MED FILL — JANUVIA 25 MG TABLET: 25 | 30 days supply | Qty: 30 | Fill #2

## 2018-01-14 MED FILL — ACCU-CHEK SOFTCLIX LANCETS: 30 days supply | Qty: 100 | Fill #3

## 2018-01-25 ENCOUNTER — Other Ambulatory Visit: Payer: Self-pay | Admitting: Cardiology

## 2018-01-28 ENCOUNTER — Other Ambulatory Visit: Payer: Self-pay | Admitting: Cardiology

## 2018-01-29 NOTE — Telephone Encounter (Signed)
Rx sent to pharmacy   

## 2018-01-31 ENCOUNTER — Other Ambulatory Visit: Payer: Self-pay

## 2018-01-31 DIAGNOSIS — I10 Essential (primary) hypertension: Secondary | ICD-10-CM

## 2018-02-09 ENCOUNTER — Other Ambulatory Visit: Payer: Self-pay | Admitting: Cardiology

## 2018-02-11 MED FILL — ATORVASTATIN 80 MG TABLET: 80 | 30 days supply | Qty: 30 | Fill #4

## 2018-02-19 MED FILL — ACCU-CHEK SOFTCLIX LANCETS: 30 days supply | Qty: 100 | Fill #4

## 2018-02-20 ENCOUNTER — Other Ambulatory Visit: Payer: Self-pay | Admitting: Nurse Practitioner

## 2018-02-20 DIAGNOSIS — I10 Essential (primary) hypertension: Secondary | ICD-10-CM

## 2018-02-22 MED FILL — BRILINTA 90 MG TABLET: 90 | 30 days supply | Qty: 60 | Fill #0

## 2018-02-28 MED FILL — METOPROLOL TARTRATE 25 MG T: 25 | 30 days supply | Qty: 60 | Fill #2

## 2018-03-11 MED FILL — JANUVIA 25 MG TABLET: 25 | 30 days supply | Qty: 30 | Fill #3

## 2018-03-20 MED FILL — ACCU-CHEK SOFTCLIX LANCETS: 30 days supply | Qty: 100 | Fill #5

## 2018-04-02 MED FILL — ATORVASTATIN 80 MG TABLET: 80 | 90 days supply | Qty: 90 | Fill #5

## 2018-04-16 MED FILL — METOPROLOL TARTRATE 25 MG T: 25 | 30 days supply | Qty: 60 | Fill #3

## 2018-04-16 MED FILL — JANUVIA 25 MG TABLET: 25 | 30 days supply | Qty: 30 | Fill #4

## 2018-05-06 MED FILL — ACCU-CHEK SOFTCLIX LANCETS: 30 days supply | Qty: 100 | Fill #6

## 2018-05-24 ENCOUNTER — Other Ambulatory Visit: Payer: Self-pay | Admitting: Nurse Practitioner

## 2018-05-24 DIAGNOSIS — I10 Essential (primary) hypertension: Secondary | ICD-10-CM

## 2018-05-24 MED FILL — JANUVIA 25 MG TABLET: 25 | 30 days supply | Qty: 30 | Fill #5

## 2018-05-24 MED FILL — METFORMIN HCL ER 500 MG TAB: 500 | 30 days supply | Qty: 120 | Fill #3

## 2018-05-24 MED FILL — METOPROLOL TARTRATE 25 MG T: 25 | 30 days supply | Qty: 60 | Fill #4

## 2018-05-31 MED FILL — BRILINTA 90 MG TABLET: 90 | 30 days supply | Qty: 60 | Fill #0

## 2018-06-24 ENCOUNTER — Ambulatory Visit: Payer: Self-pay | Admitting: Nurse Practitioner

## 2018-06-25 ENCOUNTER — Ambulatory Visit: Payer: Self-pay | Attending: Nurse Practitioner | Admitting: Nurse Practitioner

## 2018-06-25 VITALS — BP 114/73 | HR 67 | Temp 98.4°F | Ht 67.0 in | Wt 155.4 lb

## 2018-06-25 DIAGNOSIS — I10 Essential (primary) hypertension: Secondary | ICD-10-CM | POA: Insufficient documentation

## 2018-06-25 DIAGNOSIS — E119 Type 2 diabetes mellitus without complications: Secondary | ICD-10-CM

## 2018-06-25 DIAGNOSIS — I252 Old myocardial infarction: Secondary | ICD-10-CM | POA: Insufficient documentation

## 2018-06-25 DIAGNOSIS — Z7901 Long term (current) use of anticoagulants: Secondary | ICD-10-CM | POA: Insufficient documentation

## 2018-06-25 DIAGNOSIS — M25561 Pain in right knee: Secondary | ICD-10-CM

## 2018-06-25 DIAGNOSIS — I2510 Atherosclerotic heart disease of native coronary artery without angina pectoris: Secondary | ICD-10-CM | POA: Insufficient documentation

## 2018-06-25 DIAGNOSIS — Z7982 Long term (current) use of aspirin: Secondary | ICD-10-CM | POA: Insufficient documentation

## 2018-06-25 DIAGNOSIS — Z833 Family history of diabetes mellitus: Secondary | ICD-10-CM | POA: Insufficient documentation

## 2018-06-25 DIAGNOSIS — E785 Hyperlipidemia, unspecified: Secondary | ICD-10-CM | POA: Insufficient documentation

## 2018-06-25 DIAGNOSIS — Z955 Presence of coronary angioplasty implant and graft: Secondary | ICD-10-CM | POA: Insufficient documentation

## 2018-06-25 DIAGNOSIS — Z794 Long term (current) use of insulin: Secondary | ICD-10-CM

## 2018-06-25 DIAGNOSIS — Z79899 Other long term (current) drug therapy: Secondary | ICD-10-CM | POA: Insufficient documentation

## 2018-06-25 DIAGNOSIS — Z8249 Family history of ischemic heart disease and other diseases of the circulatory system: Secondary | ICD-10-CM | POA: Insufficient documentation

## 2018-06-25 DIAGNOSIS — Z7984 Long term (current) use of oral hypoglycemic drugs: Secondary | ICD-10-CM | POA: Insufficient documentation

## 2018-06-25 DIAGNOSIS — M25551 Pain in right hip: Secondary | ICD-10-CM

## 2018-06-25 LAB — GLUCOSE, POCT (MANUAL RESULT ENTRY): POC Glucose: 177 mg/dl — AB (ref 70–99)

## 2018-06-25 MED ORDER — TIZANIDINE HCL 4 MG PO TABS: 4.0000 mg | ORAL_TABLET | Freq: Four times a day (QID) | ORAL | 1 refills | Status: DC | PRN

## 2018-06-25 MED ORDER — IBUPROFEN 800 MG PO TABS: 800.0000 mg | ORAL_TABLET | Freq: Three times a day (TID) | ORAL | 1 refills | Status: DC | PRN

## 2018-06-25 NOTE — Progress Notes (Signed)
Assessment & Plan:  Arlon was seen today for hip pain.  Diagnoses and all orders for this visit:  Controlled type 2 diabetes mellitus without complication, with long-term current use of insulin (HCC) -     POCT glucose (manual entry) -     Hemoglobin A1c -     CBC -     CMP14+EGFR -     Lipid panel  Acute pain of right knee -     DG Knee Complete 4 Views Right; Future -     ibuprofen (ADVIL,MOTRIN) 800 MG tablet; Take 1 tablet (800 mg total) by mouth every 8 (eight) hours as needed.  Pain of right hip joint -     tiZANidine (ZANAFLEX) 4 MG tablet; Take 1 tablet (4 mg total) by mouth every 6 (six) hours as needed for muscle spasms. May alternate with heat and ice application for pain relief. May also alternate with acetaminophen and Ibuprofen as prescribed pain relief.  Patient has been counseled on age-appropriate routine health concerns for screening and prevention. These are reviewed and up-to-date. Referrals have been placed accordingly. Immunizations are up-to-date or declined.    Subjective:   Chief Complaint  Patient presents with  . Hip Pain   HPI Scott Mcclain 43 y.o. male presents to office today for follow up with complaints of right hip pain. He has a history of DM. VRI was used to communicate directly with patient for the entire encounter including providing detailed patient instructions.    DM Type 2 Chronic. Not well controlled. He has not been seen in this office in over a year.  He denies any hypo or hyperglycemic symptoms. He has not been monitoring his blood glucose levels and he has not been consistent with taking metformin 1000 mg BID or Januvia '25mg'$  daily. He does not exercise and is not consistent with following a diabetic diet. He is overdue for eye exam. Patient has been advised to apply for financial assistance and schedule to see our financial counselor.  Lab Results  Component Value Date   HGBA1C 7.9 (H) 06/25/2018    Right Hip Pain Pain is  constant. Onset over a year ago. He denies any injury or trauma. Pain is described as a pinching sensation in the lower right buttock. He has taken ibuprofen a few times and states it does help relieve the pain. Based on physical exam today it appears the pain is more musculoskeletal in nature.   Right Knee Pain Onset a few weeks ago. Pain is located in the anterior knee.  He denies any injury or trauma to the knee. The pain is described as aching, sharp and there is also intermittent swelling. Pain is relieved with ibuprofen. There are no aggravating factors .   Review of Systems  Constitutional: Negative for fever, malaise/fatigue and weight loss.  HENT: Negative.  Negative for nosebleeds.   Eyes: Negative.  Negative for blurred vision, double vision and photophobia.  Respiratory: Negative.  Negative for cough and shortness of breath.   Cardiovascular: Negative.  Negative for chest pain, palpitations and leg swelling.  Gastrointestinal: Negative.  Negative for heartburn, nausea and vomiting.  Musculoskeletal: Positive for joint pain and myalgias.       SEE HPI  Neurological: Negative.  Negative for dizziness, focal weakness, seizures and headaches.  Psychiatric/Behavioral: Negative.  Negative for suicidal ideas.    Past Medical History:  Diagnosis Date  . CAD in native artery    a. Inf STEMI 08/2016: [facility] 08/31/16  showed 100% prox RCA, 75% OM1, mild LV dysfunction EF 45-50%, normal LVEDP -> s/p successful stenting of prox-mid RCA with DES, consider PCI of OM1 if recurrent angina. EF 55% by f/u echo same admission.  . Hyperlipidemia   . Hypertension   . Uncontrolled diabetes mellitus ([place])     Past Surgical History:  Procedure Laterality Date  . CORONARY STENT INTERVENTION N/A 08/31/2016   Procedure: Coronary Stent Intervention;  Surgeon: Peter M Martinique, MD;  Location: [facility];  Service: Cardiovascular;  Laterality: N/A;  . LEFT HEART CATH AND CORONARY ANGIOGRAPHY N/A  08/31/2016   Procedure: Left Heart Cath and Coronary Angiography;  Surgeon: Peter M Martinique, MD;  Location: [facility];  Service: Cardiovascular;  Laterality: N/A;    Family History  Problem Relation Age of Onset  . Diabetes Mother   . Heart attack Father     Social History Reviewed with no changes to be made today.   Outpatient Medications Prior to Visit  Medication Sig Dispense Refill  . ACCU-CHEK SOFTCLIX LANCETS lancets Use as instructed 100 each 12  . aspirin 81 MG EC tablet Take 1 tablet (81 mg total) by mouth daily. Needs appointment to be seen 15 tablet 0  . atorvastatin (LIPITOR) 80 MG tablet Take 1 tablet (80 mg total) by mouth every evening. 90 tablet 3  . Blood Glucose Monitoring Suppl (ACCU-CHEK AVIVA PLUS) w/Device KIT Use as directed 1 kit 0  . BRILINTA 90 MG TABS tablet TAKE 1 TABLET BY MOUTH 2 TIMES DAILY. 60 tablet 2  . glucose blood (ACCU-CHEK AVIVA PLUS) test strip Use as instructed 100 each 12  . metFORMIN (GLUCOPHAGE-XR) 500 MG 24 hr tablet Take 2 tablets (1,000 mg total) by mouth 2 (two) times daily with a meal. 180 tablet 3  . metoprolol tartrate (LOPRESSOR) 25 MG tablet Take 1 tablet (25 mg total) by mouth 2 (two) times daily. 180 tablet 3  . nitroGLYCERIN (NITROSTAT) 0.4 MG SL tablet Place 1 tablet (0.4 mg total) under the tongue every 5 (five) minutes as needed for chest pain (up to 3 doses). 25 tablet 3  . sitaGLIPtin (JANUVIA) 25 MG tablet Take 1 tablet (25 mg total) by mouth daily. 30 tablet 5   Facility-Administered Medications Prior to Visit  Medication Dose Route Frequency Provider Last Rate Last Dose  . insulin aspart (novoLOG) injection 10 Units  10 Units Subcutaneous Once Fredia Beets R, FNP        No Known Allergies     Objective:    BP 114/73   Pulse 67   Temp 98.4 F (36.9 C) (Oral)   Ht '5\' 7"'$  (1.702 m)   Wt 155 lb 6.4 oz (70.5 kg)   SpO2 100%   BMI 24.34 kg/m  Wt Readings from Last 3 Encounters:  06/25/18 155 lb 6.4 oz  (70.5 kg)  08/20/17 153 lb (69.4 kg)  07/02/17 151 lb 12.8 oz (68.9 kg)    Physical Exam Vitals signs and nursing note reviewed.  Constitutional:      Appearance: He is well-developed.  HENT:     Head: Normocephalic and atraumatic.  Neck:     Musculoskeletal: Normal range of motion.  Cardiovascular:     Rate and Rhythm: Normal rate and regular rhythm.     Heart sounds: Normal heart sounds. No murmur. No friction rub. No gallop.   Pulmonary:     Effort: Pulmonary effort is normal. No tachypnea or respiratory distress.  Breath sounds: Normal breath sounds. No decreased breath sounds, wheezing, rhonchi or rales.  Chest:     Chest wall: No tenderness.  Abdominal:     General: Bowel sounds are normal.     Palpations: Abdomen is soft.  Musculoskeletal: Normal range of motion.     Right knee: He exhibits normal range of motion and no swelling. Tenderness found. Patellar tendon tenderness noted.     Right upper leg: He exhibits tenderness. He exhibits no bony tenderness, no swelling and no edema.       Legs:  Skin:    General: Skin is warm and dry.  Neurological:     Mental Status: He is alert and oriented to person, place, and time.     Coordination: Coordination normal.  Psychiatric:        Behavior: Behavior normal. Behavior is cooperative.        Thought Content: Thought content normal.        Judgment: Judgment normal.          Patient has been counseled extensively about nutrition and exercise as well as the importance of adherence with medications and regular follow-up. The patient was given clear instructions to go to ER or return to medical center if symptoms don't improve, worsen or new problems develop. The patient verbalized understanding.   Follow-up: Return in about 6 weeks (around 08/06/2018) for knee and right hip pain.   Gildardo Pounds, FNP-BC [facility], [place]   06/26/2018, 9:31 PM

## 2018-06-25 NOTE — Progress Notes (Signed)
cbg [id number] 

## 2018-06-26 ENCOUNTER — Encounter: Payer: Self-pay | Admitting: Nurse Practitioner

## 2018-06-26 DIAGNOSIS — Z794 Long term (current) use of insulin: Secondary | ICD-10-CM

## 2018-06-26 DIAGNOSIS — E119 Type 2 diabetes mellitus without complications: Secondary | ICD-10-CM | POA: Insufficient documentation

## 2018-06-26 LAB — CMP14+EGFR
ALK PHOS: 64 IU/L (ref 39–117)
ALT: 34 IU/L (ref 0–44)
AST: 18 IU/L (ref 0–40)
Albumin/Globulin Ratio: 2.1 (ref 1.2–2.2)
Albumin: 4.5 g/dL (ref 4.0–5.0)
BUN/Creatinine Ratio: 12 (ref 9–20)
BUN: 14 mg/dL (ref 6–24)
Bilirubin Total: 0.5 mg/dL (ref 0.0–1.2)
CO2: 24 mmol/L (ref 20–29)
CREATININE: 1.14 mg/dL (ref 0.76–1.27)
Calcium: 9.5 mg/dL (ref 8.7–10.2)
Chloride: 102 mmol/L (ref 96–106)
GFR calc Af Amer: 91 mL/min/{1.73_m2} (ref >59)
GFR calc non Af Amer: 78 mL/min/{1.73_m2} (ref >59)
Globulin, Total: 2.1 g/dL (ref 1.5–4.5)
Glucose: 134 mg/dL — ABNORMAL HIGH (ref 65–99)
Potassium: 3.9 mmol/L (ref 3.5–5.2)
Sodium: 143 mmol/L (ref 134–144)
Total Protein: 6.6 g/dL (ref 6.0–8.5)

## 2018-06-26 LAB — CBC
Hematocrit: 39.4 % (ref 37.5–51.0)
Hemoglobin: 13.6 g/dL (ref 13.0–17.7)
MCH: 27.5 pg (ref 26.6–33.0)
MCHC: 34.5 g/dL (ref 31.5–35.7)
MCV: 80 fL (ref 79–97)
Platelets: 169 10*3/uL (ref 150–450)
RBC: 4.94 x10E6/uL (ref 4.14–5.80)
RDW: 12.7 % (ref 11.6–15.4)
WBC: 6.8 10*3/uL (ref 3.4–10.8)

## 2018-06-26 LAB — HEMOGLOBIN A1C
ESTIMATED AVERAGE GLUCOSE: 180 mg/dL
Hgb A1c MFr Bld: 7.9 % — ABNORMAL HIGH (ref 4.8–5.6)

## 2018-06-26 LAB — LIPID PANEL
CHOL/HDL RATIO: 2.8 ratio (ref 0.0–5.0)
Cholesterol, Total: 135 mg/dL (ref 100–199)
HDL: 49 mg/dL (ref >39)
LDL CALC: 62 mg/dL (ref 0–99)
Triglycerides: 121 mg/dL (ref 0–149)
VLDL Cholesterol Cal: 24 mg/dL (ref 5–40)

## 2018-06-26 MED FILL — tiZANidine HCL 4 MG TABS: 4 | 15 days supply | Qty: 60 | Fill #0

## 2018-06-26 MED FILL — IBUPROFEN 800 MG TABLET: 800 | 20 days supply | Qty: 60 | Fill #0

## 2018-06-28 ENCOUNTER — Other Ambulatory Visit: Payer: Self-pay | Admitting: Pharmacist

## 2018-06-28 ENCOUNTER — Telehealth (INDEPENDENT_AMBULATORY_CARE_PROVIDER_SITE_OTHER): Payer: Self-pay

## 2018-06-28 MED ORDER — GLUCOSE BLOOD VI STRP: ORAL_STRIP | 11 refills | Status: DC

## 2018-06-28 MED ORDER — TRUE METRIX METER W/DEVICE KIT: PACK | 0 refills | Status: DC

## 2018-06-28 MED ORDER — TRUEPLUS LANCETS 28G MISC: 11 refills | Status: DC

## 2018-06-28 MED FILL — !TRUE METRIX BLOOD GLUCOSE: 1 days supply | Qty: 1 | Fill #0

## 2018-06-28 MED FILL — TRUE METRIX TEST STRIP: 25 days supply | Qty: 100 | Fill #0

## 2018-06-28 MED FILL — TRUEplus LANCETS 28G MISC: 25 days supply | Qty: 100 | Fill #0

## 2018-06-28 NOTE — Telephone Encounter (Signed)
Call placed using pacific interpreter [phone]) patient is aware that labs are normal. Advised patient to try and work on controlling diabetes. This mean limiting your intake and cutting back on tortillas, beans, fried foods, rice and potatoes. Patient expressed understanding and did not have any additional questions. Maryjean Morn, CMA

## 2018-06-28 NOTE — Telephone Encounter (Signed)
-----   Message from Claiborne RiggZelda W Fleming, NP sent at 06/26/2018  9:45 PM EST ----- All of your labs are normal. Please try to work on controlling  your diabetes. This means limiting your intake and cutting back on tortillas, beans, fried foods, rice and potatoes.

## 2018-07-01 MED FILL — METOPROLOL TARTRATE 25 MG T: 25 | 30 days supply | Qty: 60 | Fill #5

## 2018-08-03 ENCOUNTER — Other Ambulatory Visit: Payer: Self-pay

## 2018-08-03 ENCOUNTER — Emergency Department (HOSPITAL_BASED_OUTPATIENT_CLINIC_OR_DEPARTMENT_OTHER)
Admission: EM | Admit: 2018-08-03 | Discharge: 2018-08-03 | Disposition: A | Discharge status: Home / Self Care | Payer: Self-pay | Attending: Emergency Medicine | Admitting: Emergency Medicine

## 2018-08-03 ENCOUNTER — Emergency Department (HOSPITAL_BASED_OUTPATIENT_CLINIC_OR_DEPARTMENT_OTHER): Payer: Self-pay

## 2018-08-03 ENCOUNTER — Encounter (HOSPITAL_BASED_OUTPATIENT_CLINIC_OR_DEPARTMENT_OTHER): Payer: Self-pay

## 2018-08-03 DIAGNOSIS — I10 Essential (primary) hypertension: Secondary | ICD-10-CM | POA: Insufficient documentation

## 2018-08-03 DIAGNOSIS — Z7982 Long term (current) use of aspirin: Secondary | ICD-10-CM | POA: Insufficient documentation

## 2018-08-03 DIAGNOSIS — E119 Type 2 diabetes mellitus without complications: Secondary | ICD-10-CM | POA: Insufficient documentation

## 2018-08-03 DIAGNOSIS — J189 Pneumonia, unspecified organism: Secondary | ICD-10-CM | POA: Insufficient documentation

## 2018-08-03 DIAGNOSIS — I2510 Atherosclerotic heart disease of native coronary artery without angina pectoris: Secondary | ICD-10-CM | POA: Insufficient documentation

## 2018-08-03 DIAGNOSIS — Z79899 Other long term (current) drug therapy: Secondary | ICD-10-CM | POA: Insufficient documentation

## 2018-08-03 DIAGNOSIS — J181 Lobar pneumonia, unspecified organism: Secondary | ICD-10-CM

## 2018-08-03 DIAGNOSIS — Z794 Long term (current) use of insulin: Secondary | ICD-10-CM | POA: Insufficient documentation

## 2018-08-03 MED ORDER — ACETAMINOPHEN 325 MG PO TABS
650.0000 mg | ORAL_TABLET | Freq: Once | ORAL | Status: AC | PRN
  Administered 2018-08-03: 650 mg via ORAL
  Filled 2018-08-03: qty 2

## 2018-08-03 MED ORDER — DOXYCYCLINE HYCLATE 100 MG PO CAPS: 100.0000 mg | ORAL_CAPSULE | Freq: Two times a day (BID) | ORAL | 0 refills | Status: AC

## 2018-08-03 NOTE — ED Notes (Signed)
Provider at bedside

## 2018-08-03 NOTE — ED Provider Notes (Signed)
[facility] EMERGENCY DEPARTMENT Provider Note   CSN: [id number] Arrival date & time: 08/03/18  1426    History   Chief Complaint Chief Complaint  Patient presents with  . Fever    HPI Scott Mcclain is a 43 y.o. male.     HPI   Pt is a 43 y/o male with a h/o CAD, HLD, HTN, DM who presents to the ED today for evaluation of a productive cough for 1 week. He also reports fevers, generalized body aches. No significant rhinorrhea or nasal congestion. Has had intermittent sore throat that is not severe. Denies sick contacts. Has been drinking tea and taking tylenol without resolution of sxs.  Past Medical History:  Diagnosis Date  . CAD in native artery    a. Inf STEMI 08/2016: [facility] 08/31/16 showed 100% prox RCA, 75% OM1, mild LV dysfunction EF 45-50%, normal LVEDP -> s/p successful stenting of prox-mid RCA with DES, consider PCI of OM1 if recurrent angina. EF 55% by f/u echo same admission.  . Hyperlipidemia   . Hypertension   . Uncontrolled diabetes mellitus [facility])     Patient Active Problem List   Diagnosis Date Noted  . Controlled type 2 diabetes mellitus without complication, with long-term current use of insulin ([place]) 06/26/2018  . Hyperlipidemia 01/05/2017  . CAD in native artery 09/02/2016  . Essential hypertension 09/02/2016  . Ischemic cardiomyopathy 09/02/2016  . STEMI involving right coronary artery ([place]) 08/31/2016    Past Surgical History:  Procedure Laterality Date  . CORONARY STENT INTERVENTION N/A 08/31/2016   Procedure: Coronary Stent Intervention;  Surgeon: Peter M Martinique, MD;  Location: [facility];  Service: Cardiovascular;  Laterality: N/A;  . LEFT HEART CATH AND CORONARY ANGIOGRAPHY N/A 08/31/2016   Procedure: Left Heart Cath and Coronary Angiography;  Surgeon: Peter M Martinique, MD;  Location: [facility];  Service: Cardiovascular;  Laterality: N/A;        Home Medications    Prior to Admission medications   Medication  Sig Start Date End Date Taking? Authorizing Provider  aspirin 81 MG EC tablet Take 1 tablet (81 mg total) by mouth daily. Needs appointment to be seen 01/29/18   Martinique, Peter M, MD  atorvastatin (LIPITOR) 80 MG tablet Take 1 tablet (80 mg total) by mouth every evening. 08/20/17   Gildardo Pounds, NP  Blood Glucose Monitoring Suppl (TRUE METRIX METER) w/Device KIT Use as directed to check blood sugar up to 3 times daily. 06/28/18   Charlott Rakes, MD  doxycycline (VIBRAMYCIN) 100 MG capsule Take 1 capsule (100 mg total) by mouth 2 (two) times daily for 7 days. 08/03/18 08/10/18  Brok Stocking S, PA-C  glucose blood (TRUE METRIX BLOOD GLUCOSE TEST) test strip Use as directed to check blood sugar up to 3 times daily. 06/28/18   Charlott Rakes, MD  ibuprofen (ADVIL,MOTRIN) 800 MG tablet Take 1 tablet (800 mg total) by mouth every 8 (eight) hours as needed. 06/25/18   Gildardo Pounds, NP  metFORMIN (GLUCOPHAGE-XR) 500 MG 24 hr tablet Take 2 tablets (1,000 mg total) by mouth 2 (two) times daily with a meal. 07/02/17   Hairston, Maylon Peppers, FNP  metoprolol tartrate (LOPRESSOR) 25 MG tablet Take 1 tablet (25 mg total) by mouth 2 (two) times daily. 12/02/17   Gildardo Pounds, NP  nitroGLYCERIN (NITROSTAT) 0.4 MG SL tablet Place 1 tablet (0.4 mg total) under the tongue every 5 (five) minutes as needed for chest pain (up to  3 doses). 09/02/16   Dunn, Nedra Hai, PA-C  sitaGLIPtin (JANUVIA) 25 MG tablet Take 1 tablet (25 mg total) by mouth daily. 08/20/17   Gildardo Pounds, NP  tiZANidine (ZANAFLEX) 4 MG tablet Take 1 tablet (4 mg total) by mouth every 6 (six) hours as needed for muscle spasms. 06/25/18   Gildardo Pounds, NP  TRUEPLUS LANCETS 28G MISC Use as directed to check blood sugar up to 3 times daily. 06/28/18   Charlott Rakes, MD    Family History Family History  Problem Relation Age of Onset  . Diabetes Mother   . Heart attack Father     Social History Social History   Tobacco Use  . Smoking status:  Never Smoker  . Smokeless tobacco: Never Used  Substance Use Topics  . Alcohol use: Not Currently    Comment: occ  . Drug use: No     Allergies   Patient has no known allergies.   Review of Systems Review of Systems  Constitutional: Positive for fever.  HENT: Positive for sore throat. Negative for congestion, ear pain and rhinorrhea.   Eyes: Negative for visual disturbance.  Respiratory: Positive for cough. Negative for shortness of breath.   Cardiovascular: Negative for chest pain.  Gastrointestinal: Negative for abdominal pain, constipation, diarrhea, nausea and vomiting.  Genitourinary: Negative for flank pain.  Musculoskeletal: Positive for myalgias.  Skin: Negative for rash.  Neurological: Negative for headaches.  All other systems reviewed and are negative.    Physical Exam Updated Vital Signs BP 111/68 (BP Location: Right Arm)   Pulse 95   Temp (!) 102.3 F (39.1 C) (Oral)   Resp 20   Ht _0  (1.651 m)   Wt 72.6 kg   SpO2 98%   BMI 26.63 kg/m   Physical Exam Vitals signs and nursing note reviewed.  Constitutional:      Appearance: He is well-developed.  HENT:     Head: Normocephalic and atraumatic.     Ears:     Comments: Serous effusion to the right TM without bulging or erythema.  Left TM is retracted.    Nose: Congestion present.     Comments: Nasal turbinates swollen bilaterally.    Mouth/Throat:     Mouth: Mucous membranes are moist.     Pharynx: No oropharyngeal exudate or posterior oropharyngeal erythema.  Eyes:     Conjunctiva/sclera: Conjunctivae normal.  Neck:     Musculoskeletal: Neck supple.  Cardiovascular:     Rate and Rhythm: Normal rate and regular rhythm.     Heart sounds: Normal heart sounds. No murmur.  Pulmonary:     Effort: Pulmonary effort is normal. No respiratory distress.     Breath sounds: Normal breath sounds. No stridor. No wheezing or rhonchi.  Abdominal:     General: Bowel sounds are normal.     Palpations:  Abdomen is soft.     Tenderness: There is no abdominal tenderness. There is no guarding or rebound.  Skin:    General: Skin is warm and dry.  Neurological:     Mental Status: He is alert.    ED Treatments / Results  Labs (all labs ordered are listed, but only abnormal results are displayed) Labs Reviewed - No data to display  EKG None  Radiology Dg Chest 2 View  Result Date: 08/03/2018 CLINICAL DATA:  High fever and cough for 1 week.  Congestion. EXAM: CHEST - 2 VIEW COMPARISON:  10/11/2004 FINDINGS: Hazy right upper lobe airspace disease. No  other focal parenchymal opacity. No pleural effusion or pneumothorax. Stable cardiomediastinal silhouette. No acute osseous abnormality. IMPRESSION: 1. Hazy right upper lobe airspace disease concerning for pneumonia. Electronically Signed   By: Kathreen Devoid   On: 08/03/2018 16:04    Procedures Procedures (including critical care time)  Medications Ordered in ED Medications  acetaminophen (TYLENOL) tablet 650 mg (650 mg Oral Given 08/03/18 1446)     Initial Impression / Assessment and Plan / ED Course  I have reviewed the triage vital signs and the nursing notes.  Pertinent labs & imaging results that were available during my care of the patient were reviewed by me and considered in my medical decision making (see chart for details).     Final Clinical Impressions(s) / ED Diagnoses   Final diagnoses:  Community acquired pneumonia of right upper lobe of lung ([place])   Pt presenting with cough for 1 week with fevers as well. He presents to the ed febrile but otherwise has reassuring vital signs and is nontoxic appearing. His CXR shows right upper lobe pneumonia. Will start patient on doxycycline and have him f/u closely with his pcp. advised him to return if worse. He voices understanding of the plan and reasons to return to the ED. All questions answered. Pt stable for discharge.  ED Discharge Orders         Ordered    doxycycline  (VIBRAMYCIN) 100 MG capsule  2 times daily     08/03/18 [address] Berthold Glace S, PA-C 08/03/18 1619    Dorie Rank, MD 08/03/18 2215

## 2018-08-03 NOTE — Discharge Instructions (Addendum)
You were diagnosed with pneumonia on your chest xray.   You were given a prescription for antibiotics. Please take the antibiotic prescription fully.   Please follow up with your primary care provider within 5-7 days for re-evaluation of your symptoms. Please return to the emergency department for any new or worsening symptoms.

## 2018-08-03 NOTE — ED Triage Notes (Signed)
Pt states he has had a subjective "high fever" x 1 week. Pt also has associated coughing, and generalized body aches.

## 2018-08-07 ENCOUNTER — Encounter: Payer: Self-pay | Admitting: Nurse Practitioner

## 2018-08-07 ENCOUNTER — Ambulatory Visit: Payer: Self-pay | Attending: Nurse Practitioner | Admitting: Nurse Practitioner

## 2018-08-07 VITALS — BP 111/72 | HR 79 | Ht 67.0 in | Wt 153.0 lb

## 2018-08-07 DIAGNOSIS — J189 Pneumonia, unspecified organism: Secondary | ICD-10-CM

## 2018-08-07 DIAGNOSIS — Z794 Long term (current) use of insulin: Secondary | ICD-10-CM

## 2018-08-07 DIAGNOSIS — J181 Lobar pneumonia, unspecified organism: Secondary | ICD-10-CM

## 2018-08-07 DIAGNOSIS — E119 Type 2 diabetes mellitus without complications: Secondary | ICD-10-CM

## 2018-08-07 LAB — GLUCOSE, POCT (MANUAL RESULT ENTRY): POC Glucose: 220 mg/dl — AB (ref 70–99)

## 2018-08-07 NOTE — Progress Notes (Signed)
Assessment & Plan:  Scott Mcclain was seen today for follow-up.  Diagnoses and all orders for this visit:  Community acquired pneumonia of right upper lobe of lung ([place]) -     DG Chest 2 View; Future    Controlled type 2 diabetes mellitus without complication, with long-term current use of insulin (HCC) -     Glucose (CBG) Glucose is elevated today and we discussed dietary restrictions.  He ate pasta today and he is aware of how carbs adversely affect his glucose levels. He currently denies any hypo-or hyperglycemic symptoms   Continue blood sugar control as discussed in office today, low carbohydrate diet, and regular physical exercise as tolerated, 150 minutes per week (30 min each day, 5 days per week, or 50 min 3 days per week). Keep blood sugar logs with fasting goal of 90-130 mg/dl, post prandial (after you eat) less than 180.  For Hypoglycemia: BS <60 and Hyperglycemia BS >400; contact the clinic ASAP. Annual eye exams and foot exams are recommended. Continue Januvia 25 mg daily, metformin 1000 mg twice daily A1C DUE in April Lab Results  Component Value Date   HGBA1C 7.9 (H) 06/25/2018     Patient has been counseled on age-appropriate routine health concerns for screening and prevention. These are reviewed and up-to-date. Referrals have been placed accordingly. Immunizations are up-to-date or declined.    Subjective:   Chief Complaint  Patient presents with  . Follow-up    PNA   HPI Scott Mcclain 43 y.o. male presents to office today for follow up to PNA   PNA Patient seen in ED on 08/03/2018 with complaints of productive cough, fever, generalized body aches and sore throat.  Chest x-ray revealed right upper lobe pneumonia doxycycline for 7 days. Today he endorses significantly improved symptoms with minimal cough.  He is still taking doxycycline 100 mg twice daily.  I have instructed him to make sure he completes his antibiotic and does not stop just because he  feels better.  He currently denies any chest pain, shortness of breath, fevers, or productive cough.    Review of Systems  Constitutional: Negative for fever, malaise/fatigue and weight loss.  HENT: Negative.  Negative for nosebleeds.   Eyes: Negative.  Negative for blurred vision, double vision and photophobia.  Respiratory: Negative.  Negative for cough and shortness of breath.   Cardiovascular: Negative.  Negative for chest pain, palpitations and leg swelling.  Gastrointestinal: Negative.  Negative for heartburn, nausea and vomiting.  Musculoskeletal: Negative.  Negative for myalgias.  Neurological: Negative.  Negative for dizziness, focal weakness, seizures and headaches.  Psychiatric/Behavioral: Negative.  Negative for suicidal ideas.    Past Medical History:  Diagnosis Date  . CAD in native artery    a. Inf STEMI 08/2016: [place] 08/31/16 showed 100% prox RCA, 75% OM1, mild LV dysfunction EF 45-50%, normal LVEDP -> s/p successful stenting of prox-mid RCA with DES, consider PCI of OM1 if recurrent angina. EF 55% by f/u echo same admission.  . Hyperlipidemia   . Hypertension   . Uncontrolled diabetes mellitus ([place])     Past Surgical History:  Procedure Laterality Date  . CORONARY STENT INTERVENTION N/A 08/31/2016   Procedure: Coronary Stent Intervention;  Surgeon: Peter M Martinique, MD;  Location: [facility];  Service: Cardiovascular;  Laterality: N/A;  . LEFT HEART CATH AND CORONARY ANGIOGRAPHY N/A 08/31/2016   Procedure: Left Heart Cath and Coronary Angiography;  Surgeon: Peter M Martinique, MD;  Location: [facility] INVASIVE CV  LAB;  Service: Cardiovascular;  Laterality: N/A;    Family History  Problem Relation Age of Onset  . Diabetes Mother   . Heart attack Father     Social History Reviewed with no changes to be made today.   Outpatient Medications Prior to Visit  Medication Sig Dispense Refill  . aspirin 81 MG EC tablet Take 1 tablet (81 mg total) by mouth daily. Needs  appointment to be seen 15 tablet 0  . atorvastatin (LIPITOR) 80 MG tablet Take 1 tablet (80 mg total) by mouth every evening. 90 tablet 3  . Blood Glucose Monitoring Suppl (TRUE METRIX METER) w/Device KIT Use as directed to check blood sugar up to 3 times daily. 1 kit 0  . doxycycline (VIBRAMYCIN) 100 MG capsule Take 1 capsule (100 mg total) by mouth 2 (two) times daily for 7 days. 14 capsule 0  . glucose blood (TRUE METRIX BLOOD GLUCOSE TEST) test strip Use as directed to check blood sugar up to 3 times daily. 100 each 11  . ibuprofen (ADVIL,MOTRIN) 800 MG tablet Take 1 tablet (800 mg total) by mouth every 8 (eight) hours as needed. 60 tablet 1  . metFORMIN (GLUCOPHAGE-XR) 500 MG 24 hr tablet Take 2 tablets (1,000 mg total) by mouth 2 (two) times daily with a meal. 180 tablet 3  . metoprolol tartrate (LOPRESSOR) 25 MG tablet Take 1 tablet (25 mg total) by mouth 2 (two) times daily. 180 tablet 3  . nitroGLYCERIN (NITROSTAT) 0.4 MG SL tablet Place 1 tablet (0.4 mg total) under the tongue every 5 (five) minutes as needed for chest pain (up to 3 doses). 25 tablet 3  . sitaGLIPtin (JANUVIA) 25 MG tablet Take 1 tablet (25 mg total) by mouth daily. 30 tablet 5  . tiZANidine (ZANAFLEX) 4 MG tablet Take 1 tablet (4 mg total) by mouth every 6 (six) hours as needed for muscle spasms. 60 tablet 1  . TRUEPLUS LANCETS 28G MISC Use as directed to check blood sugar up to 3 times daily. 100 each 11   No facility-administered medications prior to visit.     No Known Allergies     Objective:    BP 111/72 (BP Location: Right Arm, Patient Position: Sitting, Cuff Size: Normal)   Pulse 79   Ht '5\' 7"'$  (1.702 m)   Wt 153 lb (69.4 kg)   SpO2 96%   BMI 23.96 kg/m  Wt Readings from Last 3 Encounters:  08/07/18 153 lb (69.4 kg)  08/03/18 160 lb (72.6 kg)  06/25/18 155 lb 6.4 oz (70.5 kg)    Physical Exam Vitals signs and nursing note reviewed.  Constitutional:      Appearance: He is well-developed.  HENT:       Head: Normocephalic and atraumatic.  Neck:     Musculoskeletal: Normal range of motion.  Cardiovascular:     Rate and Rhythm: Normal rate and regular rhythm.     Heart sounds: Normal heart sounds. No murmur. No friction rub. No gallop.   Pulmonary:     Effort: Pulmonary effort is normal. No tachypnea or respiratory distress.     Breath sounds: Normal breath sounds. No decreased breath sounds, wheezing, rhonchi or rales.  Chest:     Chest wall: No tenderness.  Abdominal:     General: Bowel sounds are normal.     Palpations: Abdomen is soft.  Musculoskeletal: Normal range of motion.  Skin:    General: Skin is warm and dry.  Neurological:  Mental Status: He is alert and oriented to person, place, and time.     Coordination: Coordination normal.  Psychiatric:        Behavior: Behavior normal. Behavior is cooperative.        Thought Content: Thought content normal.        Judgment: Judgment normal.          Patient has been counseled extensively about nutrition and exercise as well as the importance of adherence with medications and regular follow-up. The patient was given clear instructions to go to ER or return to medical center if symptoms don't improve, worsen or new problems develop. The patient verbalized understanding.   Follow-up: Return in about 2 months (around 10/07/2018) for needs financial paperwork and appt. See me in 2 months  .   Gildardo Pounds, FNP-BC [facility] and [place], [place]   08/07/2018, 5:14 PM

## 2018-08-13 ENCOUNTER — Other Ambulatory Visit: Payer: Self-pay | Admitting: Nurse Practitioner

## 2018-08-13 DIAGNOSIS — E1165 Type 2 diabetes mellitus with hyperglycemia: Principal | ICD-10-CM

## 2018-08-13 DIAGNOSIS — IMO0001 Reserved for inherently not codable concepts without codable children: Secondary | ICD-10-CM

## 2018-08-13 MED FILL — METOPROLOL TARTRATE 25 MG T: 25 | 30 days supply | Qty: 60 | Fill #6

## 2018-08-14 MED FILL — JANUVIA 25 MG TABLET: 25 | 30 days supply | Qty: 30 | Fill #0

## 2018-10-09 ENCOUNTER — Other Ambulatory Visit: Payer: Self-pay

## 2018-10-09 ENCOUNTER — Ambulatory Visit: Payer: Self-pay | Admitting: Nurse Practitioner

## 2018-10-11 ENCOUNTER — Other Ambulatory Visit: Payer: Self-pay

## 2018-10-11 ENCOUNTER — Other Ambulatory Visit: Payer: Self-pay | Admitting: Nurse Practitioner

## 2018-10-11 ENCOUNTER — Ambulatory Visit: Payer: Self-pay | Attending: Family Medicine

## 2018-10-11 DIAGNOSIS — IMO0002 Reserved for concepts with insufficient information to code with codable children: Secondary | ICD-10-CM

## 2018-10-11 DIAGNOSIS — E1165 Type 2 diabetes mellitus with hyperglycemia: Secondary | ICD-10-CM

## 2018-10-12 LAB — HEMOGLOBIN A1C
Est. average glucose Bld gHb Est-mCnc: 177 mg/dL
Hgb A1c MFr Bld: 7.8 % — ABNORMAL HIGH (ref 4.8–5.6)

## 2018-10-12 LAB — LIPID PANEL
Chol/HDL Ratio: 3.7 ratio (ref 0.0–5.0)
Cholesterol, Total: 176 mg/dL (ref 100–199)
HDL: 48 mg/dL (ref >39)
LDL Calculated: 110 mg/dL — ABNORMAL HIGH (ref 0–99)
Triglycerides: 92 mg/dL (ref 0–149)
VLDL Cholesterol Cal: 18 mg/dL (ref 5–40)

## 2018-10-15 MED FILL — METOPROLOL TARTRATE 25 MG T: 25 | 90 days supply | Qty: 180 | Fill #7

## 2018-10-22 ENCOUNTER — Telehealth: Payer: Self-pay

## 2018-10-22 NOTE — Telephone Encounter (Signed)
Attempt to call patient to inform on results.  No answer, CMA left a VM for patient to call back regarding his lab results.

## 2018-10-22 NOTE — Telephone Encounter (Signed)
-----   Message from Claiborne Rigg, NP sent at 10/16/2018  8:38 PM EDT ----- A1c has slightly increased. Make sure you are taking all your medications as prescribed. INSTRUCTIONS: Work on a low fat, heart healthy diet and participate in regular aerobic exercise program by working out at least 150 minutes per week; 5 days a week-30 minutes per day. Avoid red meat, fried foods. junk foods, sodas, sugary drinks, unhealthy snacking, alcohol and smoking.  Drink at least 48oz of water per day and monitor your carbohydrate intake daily.

## 2018-11-01 MED FILL — JANUVIA 25 MG TABLET: 25 | 30 days supply | Qty: 30 | Fill #1

## 2018-12-05 ENCOUNTER — Other Ambulatory Visit: Payer: Self-pay

## 2018-12-05 DIAGNOSIS — E1165 Type 2 diabetes mellitus with hyperglycemia: Secondary | ICD-10-CM

## 2018-12-05 MED ORDER — METFORMIN HCL ER 500 MG PO TB24: 1000.0000 mg | ORAL_TABLET | Freq: Two times a day (BID) | ORAL | 2 refills | Status: DC

## 2018-12-05 MED FILL — metFORMIN HCL ER 500 MG TB2: 500 | 30 days supply | Qty: 120 | Fill #0

## 2019-02-25 ENCOUNTER — Other Ambulatory Visit: Payer: Self-pay | Admitting: Nurse Practitioner

## 2019-02-25 MED FILL — METFORMIN HCL ER 500 MG TB2: 500 | 15 days supply | Qty: 60 | Fill #1

## 2019-02-25 MED FILL — METOPROLOL TARTRATE 25 MG T: 25 | 90 days supply | Qty: 180 | Fill #0

## 2019-05-02 IMAGING — CR DG CHEST 2V
2 series · 2 of 2 positions shown · non-contrast
Comparison: 10/11/2004

CLINICAL DATA: High fever and cough for 1 week.  Congestion.

EXAM:
CHEST - 2 VIEW

[w chest pa]
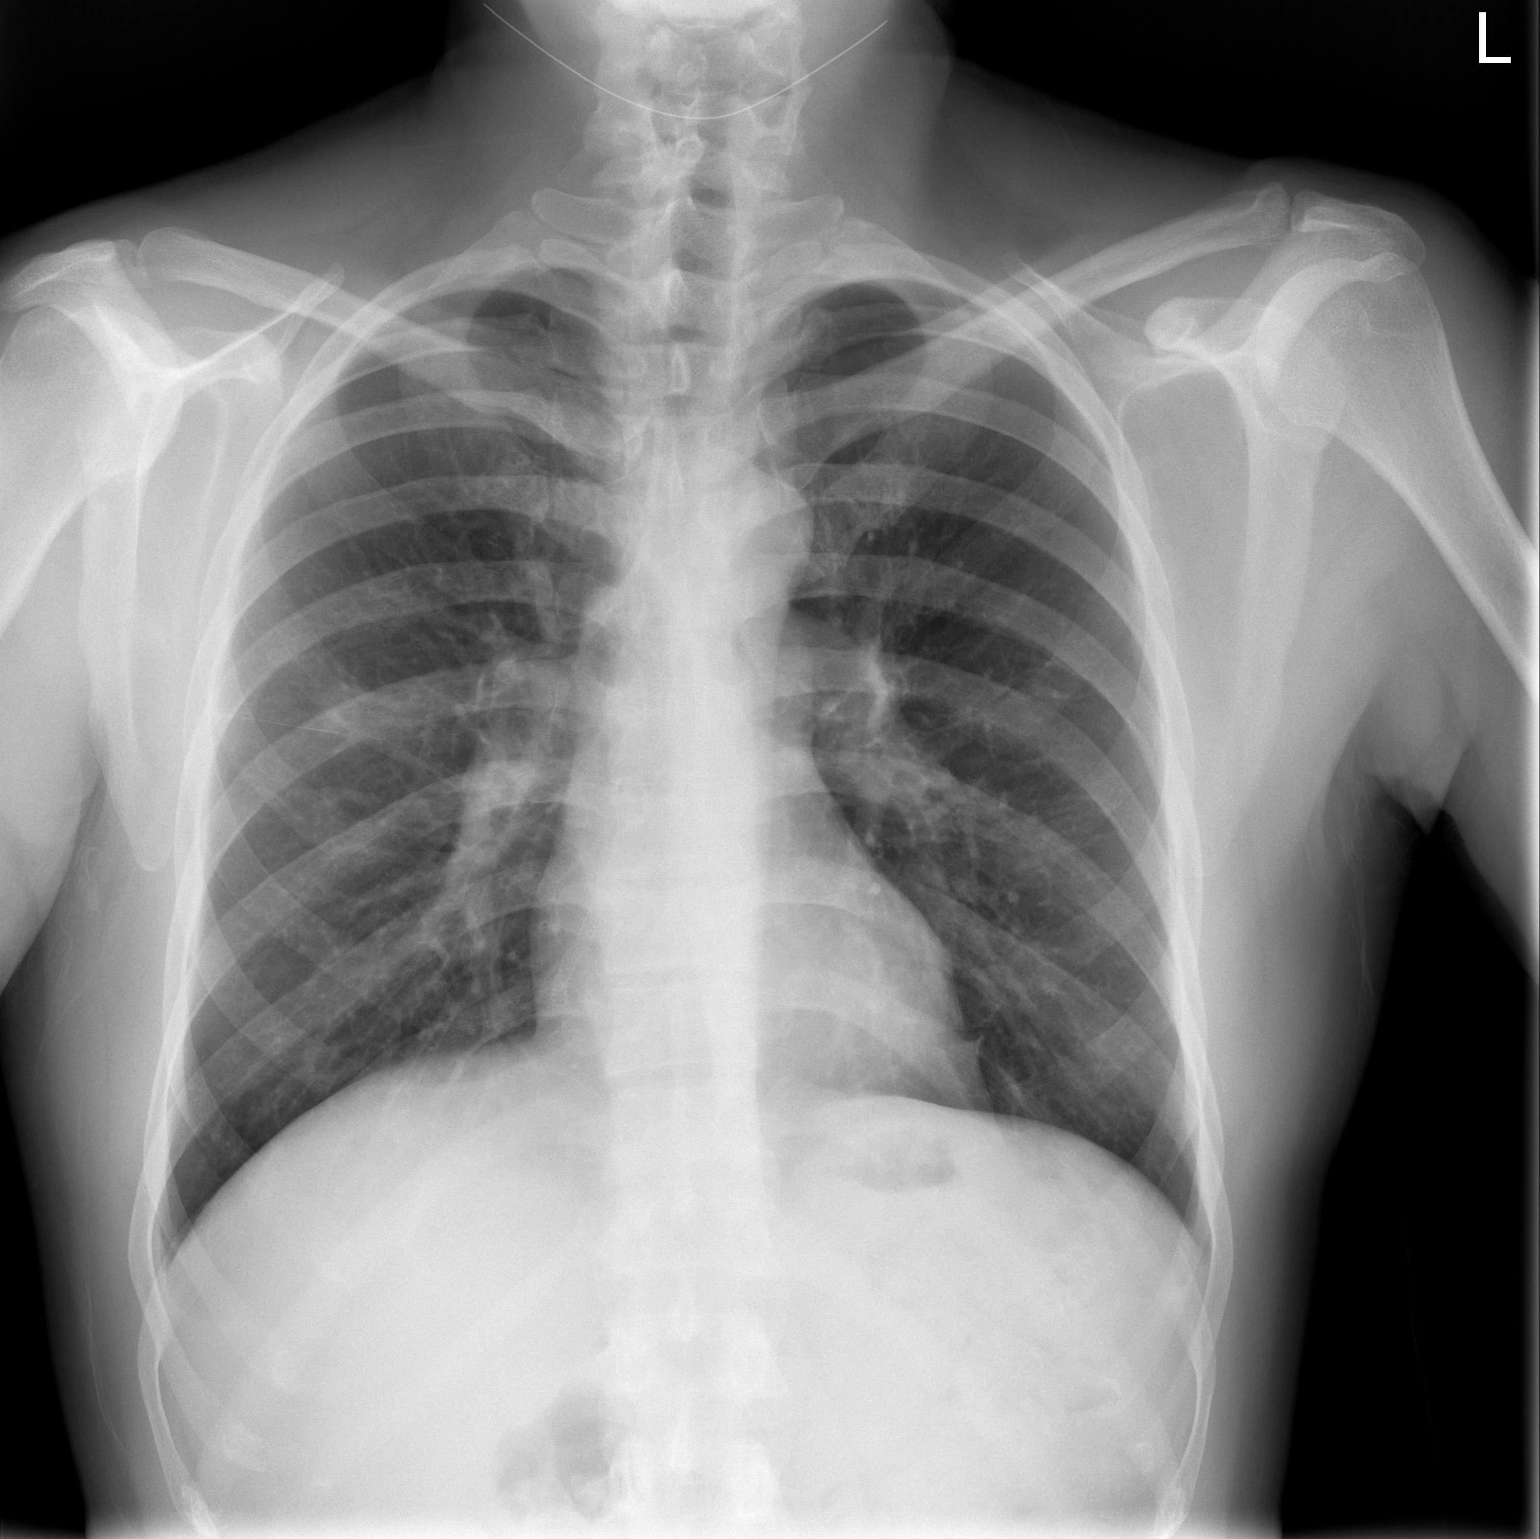

[w chest lat]
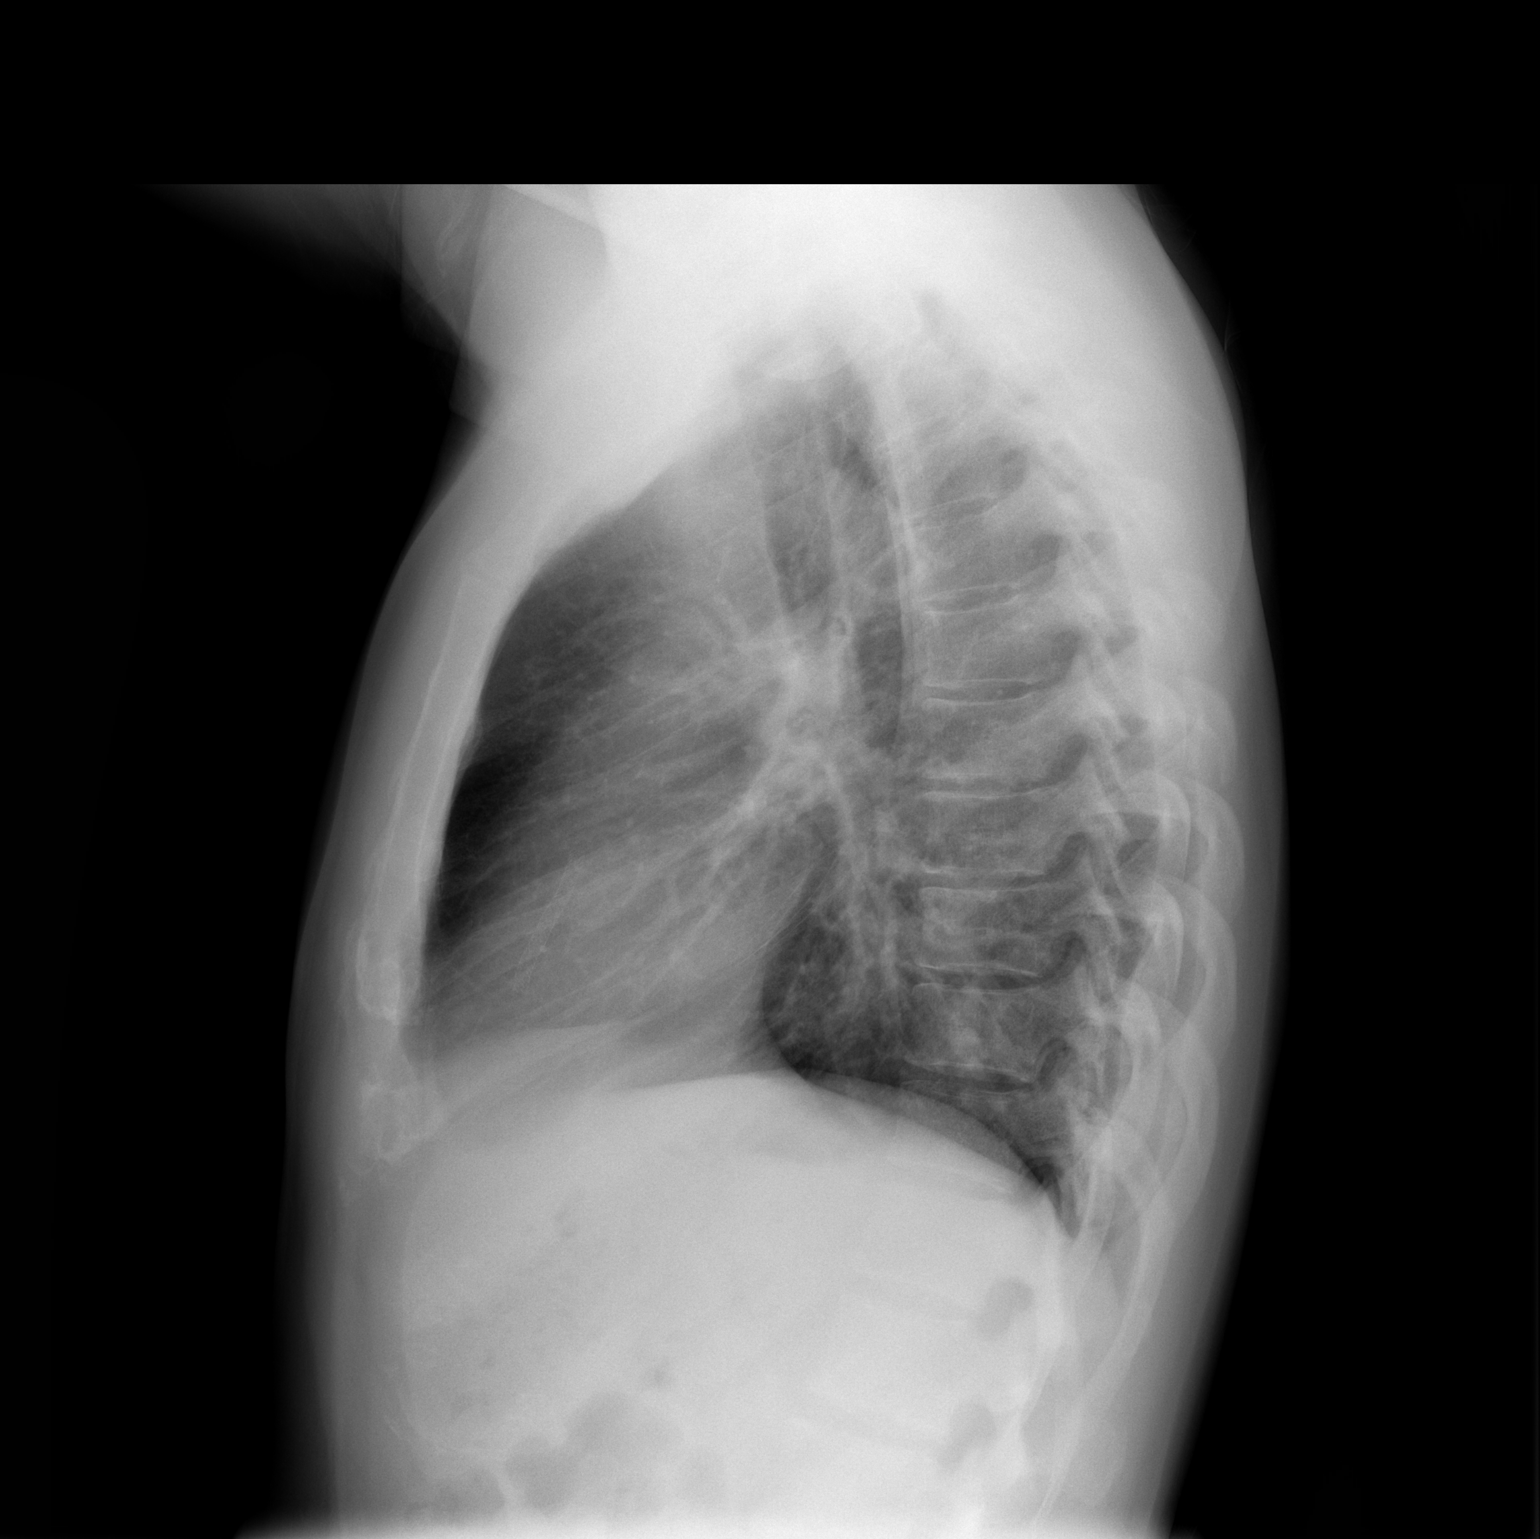

[2 of 2 positions shown; findings below may reference images not displayed]

FINDINGS: Hazy right upper lobe airspace disease. No other focal parenchymal
opacity. No pleural effusion or pneumothorax. Stable
cardiomediastinal silhouette. No acute osseous abnormality.
IMPRESSION: 1. Hazy right upper lobe airspace disease concerning for pneumonia.

## 2019-06-27 ENCOUNTER — Other Ambulatory Visit: Payer: Self-pay | Admitting: Nurse Practitioner

## 2019-06-27 DIAGNOSIS — E1165 Type 2 diabetes mellitus with hyperglycemia: Secondary | ICD-10-CM

## 2019-07-07 ENCOUNTER — Other Ambulatory Visit: Payer: Self-pay | Admitting: Nurse Practitioner

## 2019-07-07 DIAGNOSIS — E1165 Type 2 diabetes mellitus with hyperglycemia: Secondary | ICD-10-CM

## 2019-07-10 ENCOUNTER — Other Ambulatory Visit: Payer: Self-pay | Admitting: Nurse Practitioner

## 2019-07-10 DIAGNOSIS — E1165 Type 2 diabetes mellitus with hyperglycemia: Secondary | ICD-10-CM

## 2019-07-11 MED FILL — METOPROLOL TARTRATE 25 MG T: 25 | 90 days supply | Qty: 180 | Fill #1

## 2019-07-11 MED FILL — metFORMIN HCL ER 500 MG TB2: 500 | 7 days supply | Qty: 28 | Fill #0

## 2019-07-16 ENCOUNTER — Other Ambulatory Visit: Payer: Self-pay

## 2019-07-16 ENCOUNTER — Ambulatory Visit: Payer: Self-pay | Attending: Nurse Practitioner

## 2019-08-25 ENCOUNTER — Other Ambulatory Visit: Payer: Self-pay | Admitting: Family Medicine

## 2019-08-25 DIAGNOSIS — E1165 Type 2 diabetes mellitus with hyperglycemia: Secondary | ICD-10-CM

## 2019-08-27 ENCOUNTER — Other Ambulatory Visit: Payer: Self-pay | Admitting: Family Medicine

## 2019-08-27 DIAGNOSIS — E1165 Type 2 diabetes mellitus with hyperglycemia: Secondary | ICD-10-CM

## 2019-08-29 ENCOUNTER — Other Ambulatory Visit: Payer: Self-pay | Admitting: Nurse Practitioner

## 2019-08-29 ENCOUNTER — Telehealth: Payer: Self-pay | Admitting: Nurse Practitioner

## 2019-08-29 DIAGNOSIS — E1165 Type 2 diabetes mellitus with hyperglycemia: Secondary | ICD-10-CM

## 2019-08-29 MED ORDER — METFORMIN HCL ER 500 MG PO TB24: 1000.0000 mg | ORAL_TABLET | Freq: Two times a day (BID) | ORAL | 0 refills | Status: DC

## 2019-08-29 MED FILL — metFORMIN HCL ER 500 MG TB2: 500 | 10 days supply | Qty: 40 | Fill #0

## 2019-08-29 NOTE — Telephone Encounter (Signed)
Patient called and requested for his Metformin to be refilled and sent to pharmacy. Patient was informed that he has not been seen here in the office over a year. Patient and registrar set up a telephone visit to reest care since it has been over a year since last seen in office. Please follow up at your earliest convenience.

## 2019-09-03 ENCOUNTER — Ambulatory Visit: Payer: Self-pay | Attending: Nurse Practitioner | Admitting: Nurse Practitioner

## 2019-09-03 ENCOUNTER — Other Ambulatory Visit: Payer: Self-pay

## 2019-09-03 NOTE — Telephone Encounter (Signed)
Pt. Scheduled W/ PCP for an OV.

## 2019-09-11 ENCOUNTER — Other Ambulatory Visit: Payer: Self-pay

## 2019-09-18 ENCOUNTER — Ambulatory Visit: Payer: Self-pay | Admitting: Nurse Practitioner

## 2019-09-18 ENCOUNTER — Other Ambulatory Visit: Payer: Self-pay

## 2019-09-18 ENCOUNTER — Ambulatory Visit: Payer: Self-pay | Attending: Nurse Practitioner

## 2019-09-18 DIAGNOSIS — E1165 Type 2 diabetes mellitus with hyperglycemia: Secondary | ICD-10-CM

## 2019-09-19 ENCOUNTER — Ambulatory Visit: Payer: Self-pay | Attending: Nurse Practitioner | Admitting: Nurse Practitioner

## 2019-09-19 ENCOUNTER — Encounter: Payer: Self-pay | Admitting: Nurse Practitioner

## 2019-09-19 ENCOUNTER — Other Ambulatory Visit: Payer: Self-pay | Admitting: Nurse Practitioner

## 2019-09-19 VITALS — BP 111/71 | HR 65 | Ht 67.0 in | Wt 161.4 lb

## 2019-09-19 DIAGNOSIS — E782 Mixed hyperlipidemia: Secondary | ICD-10-CM

## 2019-09-19 DIAGNOSIS — E1165 Type 2 diabetes mellitus with hyperglycemia: Secondary | ICD-10-CM

## 2019-09-19 DIAGNOSIS — I2510 Atherosclerotic heart disease of native coronary artery without angina pectoris: Secondary | ICD-10-CM

## 2019-09-19 LAB — LIPID PANEL
Chol/HDL Ratio: 5.4 ratio — ABNORMAL HIGH (ref 0.0–5.0)
Cholesterol, Total: 305 mg/dL — ABNORMAL HIGH (ref 100–199)
HDL: 57 mg/dL (ref >39)
LDL Chol Calc (NIH): 224 mg/dL — ABNORMAL HIGH (ref 0–99)
Triglycerides: 133 mg/dL (ref 0–149)
VLDL Cholesterol Cal: 24 mg/dL (ref 5–40)

## 2019-09-19 LAB — CBC
Hematocrit: 44.4 % (ref 37.5–51.0)
Hemoglobin: 14.2 g/dL (ref 13.0–17.7)
MCH: 26.6 pg (ref 26.6–33.0)
MCHC: 32 g/dL (ref 31.5–35.7)
MCV: 83 fL (ref 79–97)
Platelets: 174 10*3/uL (ref 150–450)
RBC: 5.34 x10E6/uL (ref 4.14–5.80)
RDW: 13.6 % (ref 11.6–15.4)
WBC: 4.8 10*3/uL (ref 3.4–10.8)

## 2019-09-19 LAB — CMP14+EGFR
ALT: 25 IU/L (ref 0–44)
AST: 14 IU/L (ref 0–40)
Albumin/Globulin Ratio: 1.8 (ref 1.2–2.2)
Albumin: 4.6 g/dL (ref 4.0–5.0)
Alkaline Phosphatase: 70 IU/L (ref 39–117)
BUN/Creatinine Ratio: 13 (ref 9–20)
BUN: 12 mg/dL (ref 6–24)
Bilirubin Total: 0.5 mg/dL (ref 0.0–1.2)
CO2: 24 mmol/L (ref 20–29)
Calcium: 9.3 mg/dL (ref 8.7–10.2)
Chloride: 102 mmol/L (ref 96–106)
Creatinine, Ser: 0.92 mg/dL (ref 0.76–1.27)
GFR calc Af Amer: 116 mL/min/{1.73_m2} (ref >59)
GFR calc non Af Amer: 100 mL/min/{1.73_m2} (ref >59)
Globulin, Total: 2.6 g/dL (ref 1.5–4.5)
Glucose: 236 mg/dL — ABNORMAL HIGH (ref 65–99)
Potassium: 4.5 mmol/L (ref 3.5–5.2)
Sodium: 142 mmol/L (ref 134–144)
Total Protein: 7.2 g/dL (ref 6.0–8.5)

## 2019-09-19 LAB — HEMOGLOBIN A1C
Est. average glucose Bld gHb Est-mCnc: 229 mg/dL
Hgb A1c MFr Bld: 9.6 % — ABNORMAL HIGH (ref 4.8–5.6)

## 2019-09-19 LAB — MICROALBUMIN / CREATININE URINE RATIO
Creatinine, Urine: 138.5 mg/dL
Microalb/Creat Ratio: 2 mg/g creat (ref 0–29)
Microalbumin, Urine: 3.2 ug/mL

## 2019-09-19 LAB — GLUCOSE, POCT (MANUAL RESULT ENTRY): POC Glucose: 281 mg/dl — AB (ref 70–99)

## 2019-09-19 MED ORDER — METOPROLOL TARTRATE 25 MG PO TABS: 25.0000 mg | ORAL_TABLET | Freq: Two times a day (BID) | ORAL | 3 refills | Status: DC

## 2019-09-19 MED ORDER — TRUEPLUS LANCETS 28G MISC: 11 refills | Status: DC

## 2019-09-19 MED ORDER — NITROGLYCERIN 0.4 MG SL SUBL: 0.4000 mg | SUBLINGUAL_TABLET | SUBLINGUAL | 3 refills | Status: AC | PRN

## 2019-09-19 MED ORDER — ATORVASTATIN CALCIUM 80 MG PO TABS: 80.0000 mg | ORAL_TABLET | Freq: Every evening | ORAL | 3 refills | Status: DC

## 2019-09-19 MED ORDER — METFORMIN HCL ER 500 MG PO TB24: 1000.0000 mg | ORAL_TABLET | Freq: Two times a day (BID) | ORAL | 1 refills | Status: DC

## 2019-09-19 MED ORDER — CANAGLIFLOZIN 100 MG PO TABS: 100.0000 mg | ORAL_TABLET | Freq: Every day | ORAL | 0 refills | Status: AC

## 2019-09-19 MED ORDER — TRUE METRIX BLOOD GLUCOSE TEST VI STRP: ORAL_STRIP | 11 refills | Status: DC

## 2019-09-19 MED ORDER — SITAGLIPTIN PHOSPHATE 25 MG PO TABS: 25.0000 mg | ORAL_TABLET | Freq: Every day | ORAL | 0 refills | Status: DC

## 2019-09-19 MED FILL — TRUEplus LANCETS 28G MISC: 33 days supply | Qty: 100 | Fill #0

## 2019-09-19 MED FILL — TRUE METRIX TEST STRIP: 33 days supply | Qty: 100 | Fill #0

## 2019-09-19 MED FILL — metFORMIN HCL ER 500 MG TB2: 500 | 30 days supply | Qty: 120 | Fill #0

## 2019-09-19 MED FILL — ATORVASTATIN 80 MG TABLET: 80 | 30 days supply | Qty: 30 | Fill #0

## 2019-09-19 MED FILL — INVOKANA 100 MG TABLET: 100 | 30 days supply | Qty: 30 | Fill #0

## 2019-09-19 MED FILL — NITROGLYCERIN 0.4 MG TAB SL: 0.4 | 10 days supply | Qty: 25 | Fill #0

## 2019-09-19 MED FILL — JANUVIA 25 MG TABLET: 25 | 30 days supply | Qty: 30 | Fill #0

## 2019-09-19 NOTE — Progress Notes (Signed)
 Assessment & Plan:  Gareth was seen today for diabetes.  Diagnoses and all orders for this visit:  Uncontrolled type 2 diabetes mellitus with hyperglycemia (HCC) -     POCT glucose (manual entry) -     Ambulatory referral to Ophthalmology -     canagliflozin (INVOKANA) 100 MG TABS tablet; Take 1 tablet (100 mg total) by mouth daily before breakfast. -     metFORMIN (GLUCOPHAGE-XR) 500 MG 24 hr tablet; Take 2 tablets (1,000 mg total) by mouth 2 (two) times daily with a meal. -     sitaGLIPtin (JANUVIA) 25 MG tablet; Take 1 tablet (25 mg total) by mouth daily. -     glucose blood (TRUE METRIX BLOOD GLUCOSE TEST) test strip; Use as directed to check blood sugar up to 3 times daily. -     TRUEplus Lancets 28G MISC; Use as directed to check blood sugar up to 3 times daily. Continue blood sugar control as discussed in office today, low carbohydrate diet, and regular physical exercise as tolerated, 150 minutes per week (30 min each day, 5 days per week, or 50 min 3 days per week). Keep blood sugar logs with fasting goal of 90-130 mg/dl, post prandial (after you eat) less than 180.  For Hypoglycemia: BS <60 and Hyperglycemia BS >400; contact the clinic ASAP. Annual eye exams and foot exams are recommended.  Mixed hyperlipidemia -     atorvastatin (LIPITOR) 80 MG tablet; Take 1 tablet (80 mg total) by mouth every evening. INSTRUCTIONS: Work on a low fat, heart healthy diet and participate in regular aerobic exercise program by working out at least 150 minutes per week; 5 days a week-30 minutes per day. Avoid red meat/beef/steak,  fried foods. junk foods, sodas, sugary drinks, unhealthy snacking, alcohol and smoking.  Drink at least 80 oz of water per day and monitor your carbohydrate intake daily.    CAD in native artery -     metoprolol tartrate (LOPRESSOR) 25 MG tablet; Take 1 tablet (25 mg total) by mouth 2 (two) times daily. -     nitroGLYCERIN (NITROSTAT) 0.4 MG SL tablet; Place 1 tablet (0.4  mg total) under the tongue every 5 (five) minutes as needed for chest pain (up to 3 doses).    Patient has been counseled on age-appropriate routine health concerns for screening and prevention. These are reviewed and up-to-date. Referrals have been placed accordingly. Immunizations are up-to-date or declined.    Subjective:   Chief Complaint  Patient presents with  . Diabetes   HPI Scott Mcclain 44 y.o. male presents to office today for follow up.   DM TYPE 2 Not well controlled. Not dietary adherent. Monitoring his blood glucose levels 1-2 times per day. Postprandial averages: 250-300s. He stopped taking the Januvia 25 mg daily last year after running out. Endorses symptoms of hypoglycemia when blood sugars less than 200. Currently taking metformin 1000 mg BID only. Blood pressure is well controlled.  Lab Results  Component Value Date   HGBA1C 9.6 (H) 09/18/2019   Dyslipidemia LDL not at goal of <70. Denies any statin intolerance. Taking atorvastatin 80 mg daily.  Lab Results  Component Value Date   LDLCALC 224 (H) 09/18/2019   BP Readings from Last 3 Encounters:  09/19/19 111/71  08/07/18 111/72  08/03/18 128/86   Review of Systems  Constitutional: Negative for fever, malaise/fatigue and weight loss.  HENT: Negative.  Negative for nosebleeds.   Eyes: Negative.  Negative for blurred vision, double   vision and photophobia.  Respiratory: Negative.  Negative for cough and shortness of breath.   Cardiovascular: Negative.  Negative for chest pain, palpitations and leg swelling.  Gastrointestinal: Negative.  Negative for heartburn, nausea and vomiting.  Musculoskeletal: Negative.  Negative for myalgias.  Neurological: Negative.  Negative for dizziness, focal weakness, seizures and headaches.  Psychiatric/Behavioral: Negative.  Negative for suicidal ideas.    Past Medical History:  Diagnosis Date  . CAD in native artery    a. Inf STEMI 08/2016: [place] 08/31/16 showed 100%  prox RCA, 75% OM1, mild LV dysfunction EF 45-50%, normal LVEDP -> s/p successful stenting of prox-mid RCA with DES, consider PCI of OM1 if recurrent angina. EF 55% by f/u echo same admission.  . Hyperlipidemia   . Hypertension   . Uncontrolled diabetes mellitus (HCC)     Past Surgical History:  Procedure Laterality Date  . CORONARY STENT INTERVENTION N/A 08/31/2016   Procedure: Coronary Stent Intervention;  Surgeon: Peter M Jordan, MD;  Location: [facility] INVASIVE CV LAB;  Service: Cardiovascular;  Laterality: N/A;  . LEFT HEART CATH AND CORONARY ANGIOGRAPHY N/A 08/31/2016   Procedure: Left Heart Cath and Coronary Angiography;  Surgeon: Peter M Jordan, MD;  Location: [facility] INVASIVE CV LAB;  Service: Cardiovascular;  Laterality: N/A;    Family History  Problem Relation Age of Onset  . Diabetes Mother   . Heart attack Father     Social History Reviewed with no changes to be made today.   Outpatient Medications Prior to Visit  Medication Sig Dispense Refill  . aspirin 81 MG EC tablet Take 1 tablet (81 mg total) by mouth daily. Needs appointment to be seen 15 tablet 0  . Blood Glucose Monitoring Suppl (TRUE METRIX METER) w/Device KIT Use as directed to check blood sugar up to 3 times daily. 1 kit 0  . tiZANidine (ZANAFLEX) 4 MG tablet Take 1 tablet (4 mg total) by mouth every 6 (six) hours as needed for muscle spasms. 60 tablet 1  . atorvastatin (LIPITOR) 80 MG tablet Take 1 tablet (80 mg total) by mouth every evening. 90 tablet 3  . glucose blood (TRUE METRIX BLOOD GLUCOSE TEST) test strip Use as directed to check blood sugar up to 3 times daily. 100 each 11  . ibuprofen (ADVIL,MOTRIN) 800 MG tablet Take 1 tablet (800 mg total) by mouth every 8 (eight) hours as needed. 60 tablet 1  . JANUVIA 25 MG tablet TAKE 1 TABLET BY MOUTH DAILY. 30 tablet 2  . metoprolol tartrate (LOPRESSOR) 25 MG tablet TAKE 1 TABLET BY MOUTH 2 TIMES DAILY. 180 tablet 3  . nitroGLYCERIN (NITROSTAT) 0.4 MG SL tablet Place 1  tablet (0.4 mg total) under the tongue every 5 (five) minutes as needed for chest pain (up to 3 doses). 25 tablet 3  . TRUEPLUS LANCETS 28G MISC Use as directed to check blood sugar up to 3 times daily. 100 each 11  . metFORMIN (GLUCOPHAGE-XR) 500 MG 24 hr tablet Take 2 tablets (1,000 mg total) by mouth 2 (two) times daily with a meal for 10 days. 40 tablet 0   No facility-administered medications prior to visit.    No Known Allergies     Objective:    BP 111/71   Pulse 65   Ht 5' 7" (1.702 m)   Wt 161 lb 6.4 oz (73.2 kg)   SpO2 99%   BMI 25.28 kg/m  Wt Readings from Last 3 Encounters:  09/19/19 161 lb 6.4 oz (73.2 kg)    08/07/18 153 lb (69.4 kg)  08/03/18 160 lb (72.6 kg)    Physical Exam Vitals and nursing note reviewed.  Constitutional:      Appearance: He is well-developed.  HENT:     Head: Normocephalic and atraumatic.  Cardiovascular:     Rate and Rhythm: Normal rate and regular rhythm.     Heart sounds: Normal heart sounds. No murmur. No friction rub. No gallop.   Pulmonary:     Effort: Pulmonary effort is normal. No tachypnea or respiratory distress.     Breath sounds: Normal breath sounds. No decreased breath sounds, wheezing, rhonchi or rales.  Chest:     Chest wall: No tenderness.  Abdominal:     General: Bowel sounds are normal.     Palpations: Abdomen is soft.  Musculoskeletal:        General: Normal range of motion.     Cervical back: Normal range of motion.  Skin:    General: Skin is warm and dry.  Neurological:     Mental Status: He is alert and oriented to person, place, and time.     Coordination: Coordination normal.  Psychiatric:        Behavior: Behavior normal. Behavior is cooperative.        Thought Content: Thought content normal.        Judgment: Judgment normal.          Patient has been counseled extensively about nutrition and exercise as well as the importance of adherence with medications and regular follow-up. The patient was  given clear instructions to go to ER or return to medical center if symptoms don't improve, worsen or new problems develop. The patient verbalized understanding.   Follow-up: Return in about 6 weeks (around 10/31/2019) for meter check with LUKE then see me in 3 months.   Zelda W Fleming, FNP-BC [facility] [place], [place] [phone]   09/19/2019, 1:23 PM 

## 2019-10-31 ENCOUNTER — Ambulatory Visit: Payer: Self-pay | Admitting: Pharmacist

## 2019-11-14 ENCOUNTER — Ambulatory Visit: Payer: Self-pay | Admitting: Pharmacist

## 2019-11-17 ENCOUNTER — Ambulatory Visit: Payer: Self-pay | Admitting: Pharmacist

## 2019-11-21 ENCOUNTER — Ambulatory Visit: Payer: Self-pay | Attending: Nurse Practitioner | Admitting: Pharmacist

## 2019-11-21 ENCOUNTER — Encounter: Payer: Self-pay | Admitting: Pharmacist

## 2019-11-21 ENCOUNTER — Other Ambulatory Visit: Payer: Self-pay

## 2019-11-21 DIAGNOSIS — E1165 Type 2 diabetes mellitus with hyperglycemia: Secondary | ICD-10-CM

## 2019-11-21 MED ORDER — SITAGLIPTIN PHOSPHATE 100 MG PO TABS: 100.0000 mg | ORAL_TABLET | Freq: Every day | ORAL | 2 refills | Status: DC

## 2019-11-21 MED FILL — !JANUVIA 100MG TABLET: 100 | 30 days supply | Qty: 30 | Fill #0

## 2019-11-21 NOTE — Progress Notes (Signed)
    S:    PCP: Zelda   No chief complaint on file.  Patient arrives in good spirits.  Presents for diabetes evaluation, education, and management Patient was referred and last seen by Primary Care Provider on 09/19/2019.    Patient reports Diabetes was diagnosed ~3 yrs ago.   Family/Social History:  - FHx: DM, MI - Tobacco: never smoker  - Alcohol: occasionally    Insurance coverage/medication affordability: self pay  Medication adherence: denies. Only taking 500 mg of metformin BID, he is not taking two tabs of metformin 500 mg XR Bid as prescribed. Continues to not take Invokana. Endorses compliance with Januvia. I checked our pharmacy records and he is >30 days late on all prescriptions.  Current diabetes medications include: metformin 500 mg XR (takes 2 tabs BID), Januvia 25 mg daily, Invokana 100 mg daily   Patient denies hypoglycemic events.  Patient reported dietary habits:  - Reports that he limits corn and tortillas   Patient-reported exercise habits:  - None    Patient denies nocturia (nighttime urination).  Patient denies neuropathy (nerve pain). Patient denies visual changes. Patient reports self foot exams.     O:  POCT: 257   Lab Results  Component Value Date   HGBA1C 9.6 (H) 09/18/2019   There were no vitals filed for this visit.  Lipid Panel     Component Value Date/Time   CHOL 305 (H) 09/18/2019 0910   TRIG 133 09/18/2019 0910   HDL 57 09/18/2019 0910   CHOLHDL 5.4 (H) 09/18/2019 0910   CHOLHDL 3.7 09/01/2016 0334   VLDL 18 09/01/2016 0334   LDLCALC 224 (H) 09/18/2019 0910    Home fasting blood sugars: reports 150s-250s  2 hour post-meal/random blood sugars: not checking.   Clinical Atherosclerotic Cardiovascular Disease (ASCVD): Yes  The ASCVD Risk score Denman George DC Jr., et al., 2013) failed to calculate for the following reasons:   The patient has a prior MI or stroke diagnosis    A/P: Diabetes longstanding currently uncontrolled.  Patient is able to verbalize appropriate hypoglycemia management plan. Patient is not adherent to medications. Control is suboptimal due to this. With his ASCVD, patient will benefit from Victoza. He declines injections today. Will increase Januvia to 100 mg daily. Emphasized the importance of adherence to full metformin dose and Invokana. This is not an ideal regimen for him, but pt is unwilling to make further changes at this time.  -Inc Januvia to 100 mg daily.  -Continue metformin, Invokana. Stressed the need to take as prescribed.  -Extensively discussed pathophysiology of diabetes, recommended lifestyle interventions, dietary effects on blood sugar control -Counseled on s/sx of and management of hypoglycemia -Next A1C anticipated 12/2019.   Written patient instructions provided.  Total time in face to face counseling 15 minutes.   Follow up Pharmacist Clinic Visit in 1 month.    Butch Penny, PharmD, CPP Clinical Pharmacist [facility] [phone]

## 2019-12-17 MED FILL — ATORVASTATIN 80 MG TABLET: 80 | 30 days supply | Qty: 30 | Fill #1

## 2019-12-17 MED FILL — !JANUVIA 100MG TABLET: 100 | 30 days supply | Qty: 30 | Fill #1

## 2019-12-17 MED FILL — TRUEplus LANCETS 28G MISC: 33 days supply | Qty: 100 | Fill #1

## 2019-12-17 MED FILL — METOPROLOL TARTRATE 25 MG T: 25 | 90 days supply | Qty: 180 | Fill #2

## 2019-12-17 MED FILL — NITROGLYCERIN 0.4 MG TAB SL: 0.4 | 10 days supply | Qty: 25 | Fill #1

## 2019-12-17 MED FILL — INVOKANA 100 MG TABLET: 100 | 30 days supply | Qty: 30 | Fill #1

## 2019-12-17 MED FILL — metFORMIN HCL ER 500 MG TB2: 500 | 30 days supply | Qty: 120 | Fill #1

## 2019-12-17 MED FILL — TRUE METRIX TEST STRIP: 33 days supply | Qty: 100 | Fill #1

## 2019-12-19 ENCOUNTER — Other Ambulatory Visit: Payer: Self-pay

## 2019-12-19 ENCOUNTER — Ambulatory Visit: Payer: Self-pay | Attending: Nurse Practitioner | Admitting: Nurse Practitioner

## 2019-12-19 NOTE — Progress Notes (Signed)
Error

## 2019-12-20 ENCOUNTER — Other Ambulatory Visit: Payer: Self-pay | Admitting: Nurse Practitioner

## 2019-12-20 DIAGNOSIS — I10 Essential (primary) hypertension: Secondary | ICD-10-CM

## 2019-12-20 DIAGNOSIS — Z794 Long term (current) use of insulin: Secondary | ICD-10-CM

## 2019-12-22 ENCOUNTER — Ambulatory Visit: Payer: Self-pay | Admitting: Pharmacist

## 2019-12-26 ENCOUNTER — Other Ambulatory Visit: Payer: Self-pay

## 2020-05-14 MED FILL — METOPROLOL TARTRATE 25 MG T: 25 | 30 days supply | Qty: 60 | Fill #0

## 2020-06-25 MED FILL — metFORMIN HCL ER 500 MG TB2: 500 | 30 days supply | Qty: 120 | Fill #2

## 2020-07-08 MED FILL — METOPROLOL TARTRATE 25 MG T: 25 | 30 days supply | Qty: 60 | Fill #1

## 2020-08-27 MED FILL — METOPROLOL TARTRATE 25 MG T: 25 | 30 days supply | Qty: 60 | Fill #2

## 2020-10-21 ENCOUNTER — Other Ambulatory Visit: Payer: Self-pay | Admitting: Nurse Practitioner

## 2020-10-21 DIAGNOSIS — I2510 Atherosclerotic heart disease of native coronary artery without angina pectoris: Secondary | ICD-10-CM

## 2020-10-21 NOTE — Telephone Encounter (Signed)
Requested medication (s) are due for refill today: Yes  Requested medication (s) are on the active medication list: Yes  Last refill:  09/19/19  Future visit scheduled: No  Notes to clinic:  Prescription has expired.    Requested Prescriptions  Pending Prescriptions Disp Refills   metoprolol tartrate (LOPRESSOR) 25 MG tablet 180 tablet 3    Sig: TAKE 1 TABLET (25 MG TOTAL) BY MOUTH 2 (TWO) TIMES DAILY.      Cardiovascular:  Beta Blockers Failed - 10/21/2020  9:52 AM      Failed - Valid encounter within last 6 months    Recent Outpatient Visits           11 months ago Uncontrolled type 2 diabetes mellitus with hyperglycemia [facility])   [place] [facility] [place], Cornelius Moras, RPH-CPP   1 year ago Uncontrolled type 2 diabetes mellitus with hyperglycemia [facility])   [place] [facility] [place], [place] W, NP   2 years ago Community acquired pneumonia of right upper lobe of lung [facility])   [place] [facility] [place], [place] W, NP   2 years ago Controlled type 2 diabetes mellitus without complication, with long-term current use of insulin [facility])   [place] [facility] [place], [place] W, NP   3 years ago Uncontrolled type 2 diabetes mellitus without complication, without long-term current use of insulin [facility])    [facility] [place], [place] W, NP                Passed - Last BP in normal range    BP Readings from Last 1 Encounters:  09/19/19 111/71          Passed - Last Heart Rate in normal range    Pulse Readings from Last 1 Encounters:  09/19/19 65

## 2020-10-22 ENCOUNTER — Other Ambulatory Visit: Payer: Self-pay

## 2020-10-25 ENCOUNTER — Telehealth: Payer: Self-pay | Admitting: Nurse Practitioner

## 2020-10-25 ENCOUNTER — Other Ambulatory Visit: Payer: Self-pay

## 2020-10-25 DIAGNOSIS — I2510 Atherosclerotic heart disease of native coronary artery without angina pectoris: Secondary | ICD-10-CM

## 2020-10-25 MED ORDER — METOPROLOL TARTRATE 25 MG PO TABS
ORAL_TABLET | Freq: Two times a day (BID) | ORAL | 1 refills | Status: DC
  Filled 2020-10-25: qty 60, 30d supply, fill #0
  Filled 2021-01-03: qty 60, 30d supply, fill #1

## 2020-10-25 NOTE — Telephone Encounter (Signed)
Rx sent to our pharmacy to last until 12/17/2020 appointment.

## 2020-10-25 NOTE — Telephone Encounter (Signed)
Copied from CRM [id number]. Topic: General - Other >> Oct 22, 2020  9:08 AM Scott Mcclain A wrote: Reason for CRM: Patient previously submitted a refill request for metoprolol tartrate (LOPRESSOR) 25 MG tablet   Patient was told that their request was denied because they have not been seen for a medication follow up   Patient has scheduled an appointment to be seen on 12/17/20  Patient would like to be contacted by staff to further discuss their refill request when possible  Please contact to further advise

## 2020-10-26 ENCOUNTER — Other Ambulatory Visit: Payer: Self-pay

## 2020-10-26 NOTE — Telephone Encounter (Signed)
Patient notified

## 2020-10-28 ENCOUNTER — Other Ambulatory Visit: Payer: Self-pay

## 2020-11-02 ENCOUNTER — Other Ambulatory Visit: Payer: Self-pay

## 2020-11-15 ENCOUNTER — Other Ambulatory Visit: Payer: Self-pay | Admitting: Nurse Practitioner

## 2020-11-15 DIAGNOSIS — E1165 Type 2 diabetes mellitus with hyperglycemia: Secondary | ICD-10-CM

## 2020-11-15 NOTE — Telephone Encounter (Signed)
   Notes to clinic: Patient has appt on 12/17/2020 Review for supply until that time    Requested Prescriptions  Pending Prescriptions Disp Refills   metFORMIN (GLUCOPHAGE-XR) 500 MG 24 hr tablet 360 tablet 1    Sig: TAKE 2 TABLETS (1,000 MG TOTAL) BY MOUTH 2 (TWO) TIMES DAILY WITH A MEAL.      Endocrinology:  Diabetes - Biguanides Failed - 11/15/2020  2:48 PM      Failed - Cr in normal range and within 360 days    Creatinine, Ser  Date Value Ref Range Status  09/18/2019 0.92 0.76 - 1.27 mg/dL Final   Creatinine, POC  Date Value Ref Range Status  09/27/2016 50 mg/dL Final          Failed - HBA1C is between 0 and 7.9 and within 180 days    Hgb A1c MFr Bld  Date Value Ref Range Status  09/18/2019 9.6 (H) 4.8 - 5.6 % Final    Comment:             Prediabetes: 5.7 - 6.4          Diabetes: >6.4          Glycemic control for adults with diabetes: <7.0           Failed - eGFR in normal range and within 360 days    GFR calc Af Amer  Date Value Ref Range Status  09/18/2019 116 >59 mL/min/1.73 Final   GFR calc non Af Amer  Date Value Ref Range Status  09/18/2019 100 >59 mL/min/1.73 Final          Failed - Valid encounter within last 6 months    Recent Outpatient Visits           12 months ago Uncontrolled type 2 diabetes mellitus with hyperglycemia [facility])   [place], Jarome Matin, RPH-CPP   1 year ago Uncontrolled type 2 diabetes mellitus with hyperglycemia [facility])   [place], [place] W, NP   2 years ago Community acquired pneumonia of right upper lobe of lung [facility])   [place], [place] W, NP   2 years ago Controlled type 2 diabetes mellitus without complication, with long-term current use of insulin [facility])   [place], [place] W, NP   3 years ago Uncontrolled type 2 diabetes mellitus without complication, without  long-term current use of insulin [facility])   [place], Vernia Buff, NP       Future Appointments             In 1 month Gildardo Pounds, NP [place]

## 2020-11-17 ENCOUNTER — Other Ambulatory Visit: Payer: Self-pay

## 2020-11-17 ENCOUNTER — Other Ambulatory Visit: Payer: Self-pay | Admitting: Nurse Practitioner

## 2020-11-17 DIAGNOSIS — E1165 Type 2 diabetes mellitus with hyperglycemia: Secondary | ICD-10-CM

## 2020-11-17 NOTE — Telephone Encounter (Signed)
   Notes to clinic: Patient has appt on 12/17/2020 Review for short supply    Requested Prescriptions  Pending Prescriptions Disp Refills   metFORMIN (GLUCOPHAGE-XR) 500 MG 24 hr tablet 360 tablet 1    Sig: TAKE 2 TABLETS (1,000 MG TOTAL) BY MOUTH 2 (TWO) TIMES DAILY WITH A MEAL.      Endocrinology:  Diabetes - Biguanides Failed - 11/17/2020 10:46 AM      Failed - Cr in normal range and within 360 days    Creatinine, Ser  Date Value Ref Range Status  09/18/2019 0.92 0.76 - 1.27 mg/dL Final   Creatinine, POC  Date Value Ref Range Status  09/27/2016 50 mg/dL Final          Failed - HBA1C is between 0 and 7.9 and within 180 days    Hgb A1c MFr Bld  Date Value Ref Range Status  09/18/2019 9.6 (H) 4.8 - 5.6 % Final    Comment:             Prediabetes: 5.7 - 6.4          Diabetes: >6.4          Glycemic control for adults with diabetes: <7.0           Failed - eGFR in normal range and within 360 days    GFR calc Af Amer  Date Value Ref Range Status  09/18/2019 116 >59 mL/min/1.73 Final   GFR calc non Af Amer  Date Value Ref Range Status  09/18/2019 100 >59 mL/min/1.73 Final          Failed - Valid encounter within last 6 months    Recent Outpatient Visits           12 months ago Uncontrolled type 2 diabetes mellitus with hyperglycemia [facility])   [place], Jarome Matin, RPH-CPP   1 year ago Uncontrolled type 2 diabetes mellitus with hyperglycemia [facility])   [place], [place] W, NP   2 years ago Community acquired pneumonia of right upper lobe of lung [facility])   [place], [place] W, NP   2 years ago Controlled type 2 diabetes mellitus without complication, with long-term current use of insulin [facility])   [place], [place] W, NP   3 years ago Uncontrolled type 2 diabetes mellitus without complication, without long-term  current use of insulin [facility])   [place], Vernia Buff, NP       Future Appointments             In 1 month Gildardo Pounds, NP [place]

## 2020-11-18 ENCOUNTER — Other Ambulatory Visit: Payer: Self-pay

## 2020-11-18 ENCOUNTER — Telehealth: Payer: Self-pay | Admitting: Nurse Practitioner

## 2020-11-18 NOTE — Telephone Encounter (Signed)
Patient is calling to get a refill on his Metformin.  He has an appt. Scheduled for 7/15 and would like a courtesy refill.  Please advise.

## 2020-11-19 NOTE — Telephone Encounter (Signed)
Please see encounter

## 2020-11-19 NOTE — Telephone Encounter (Signed)
Even though he has an appointment scheduled he has not had labs resulted in >1 year. Metformin refill will have to approved by PCP. Will route to her but it is likely pt will need labs before this can be filled.

## 2020-11-21 NOTE — Telephone Encounter (Signed)
REFILL REQUEST DENIED

## 2020-11-21 NOTE — Telephone Encounter (Signed)
NEEDS LABS

## 2020-11-22 ENCOUNTER — Other Ambulatory Visit: Payer: Self-pay

## 2020-12-17 ENCOUNTER — Ambulatory Visit: Payer: Self-pay | Admitting: Nurse Practitioner

## 2021-01-03 ENCOUNTER — Other Ambulatory Visit: Payer: Self-pay

## 2021-01-10 ENCOUNTER — Other Ambulatory Visit: Payer: Self-pay | Admitting: Nurse Practitioner

## 2021-01-10 ENCOUNTER — Other Ambulatory Visit: Payer: Self-pay

## 2021-01-10 DIAGNOSIS — E1165 Type 2 diabetes mellitus with hyperglycemia: Secondary | ICD-10-CM

## 2021-01-10 NOTE — Telephone Encounter (Signed)
  Notes to clinic:  Patient has upcoming appt on 02/01/2021 Review for supply until then  Medication last filled 06/2020   Requested Prescriptions  Pending Prescriptions Disp Refills   metFORMIN (GLUCOPHAGE-XR) 500 MG 24 hr tablet 360 tablet 1    Sig: TAKE 2 TABLETS (1,000 MG TOTAL) BY MOUTH 2 (TWO) TIMES DAILY WITH A MEAL.      Endocrinology:  Diabetes - Biguanides Failed - 01/10/2021  9:00 AM      Failed - Cr in normal range and within 360 days    Creatinine, Ser  Date Value Ref Range Status  09/18/2019 0.92 0.76 - 1.27 mg/dL Final   Creatinine, POC  Date Value Ref Range Status  09/27/2016 50 mg/dL Final          Failed - HBA1C is between 0 and 7.9 and within 180 days    Hgb A1c MFr Bld  Date Value Ref Range Status  09/18/2019 9.6 (H) 4.8 - 5.6 % Final    Comment:             Prediabetes: 5.7 - 6.4          Diabetes: >6.4          Glycemic control for adults with diabetes: <7.0           Failed - eGFR in normal range and within 360 days    GFR calc Af Amer  Date Value Ref Range Status  09/18/2019 116 >59 mL/min/1.73 Final   GFR calc non Af Amer  Date Value Ref Range Status  09/18/2019 100 >59 mL/min/1.73 Final          Failed - Valid encounter within last 6 months    Recent Outpatient Visits           1 year ago Uncontrolled type 2 diabetes mellitus with hyperglycemia [facility])   [place], Jarome Matin, RPH-CPP   1 year ago Uncontrolled type 2 diabetes mellitus with hyperglycemia [facility])   [place], [place] W, NP   2 years ago Community acquired pneumonia of right upper lobe of lung [facility])   [place], [place] W, NP   2 years ago Controlled type 2 diabetes mellitus without complication, with long-term current use of insulin [facility])   [place], [place] W, NP   3 years ago Uncontrolled type 2 diabetes mellitus  without complication, without long-term current use of insulin [facility])   [place], Vernia Buff, NP       Future Appointments             In 3 weeks Gildardo Pounds, NP [place]

## 2021-01-12 ENCOUNTER — Other Ambulatory Visit: Payer: Self-pay

## 2021-02-01 ENCOUNTER — Other Ambulatory Visit: Payer: Self-pay

## 2021-02-01 ENCOUNTER — Ambulatory Visit: Payer: Self-pay | Attending: Nurse Practitioner | Admitting: Nurse Practitioner

## 2021-02-01 ENCOUNTER — Encounter: Payer: Self-pay | Admitting: Nurse Practitioner

## 2021-02-01 VITALS — BP 121/69 | HR 69 | Ht 65.0 in | Wt 164.5 lb

## 2021-02-01 DIAGNOSIS — E1165 Type 2 diabetes mellitus with hyperglycemia: Secondary | ICD-10-CM

## 2021-02-01 DIAGNOSIS — Z130 Encounter for screening for diseases of the blood and blood-forming organs and certain disorders involving the immune mechanism: Secondary | ICD-10-CM

## 2021-02-01 DIAGNOSIS — M25551 Pain in right hip: Secondary | ICD-10-CM

## 2021-02-01 DIAGNOSIS — I2510 Atherosclerotic heart disease of native coronary artery without angina pectoris: Secondary | ICD-10-CM

## 2021-02-01 DIAGNOSIS — Z794 Long term (current) use of insulin: Secondary | ICD-10-CM

## 2021-02-01 DIAGNOSIS — E785 Hyperlipidemia, unspecified: Secondary | ICD-10-CM

## 2021-02-01 LAB — GLUCOSE, POCT (MANUAL RESULT ENTRY): POC Glucose: 257 mg/dl — AB (ref 70–99)

## 2021-02-01 LAB — POCT GLYCOSYLATED HEMOGLOBIN (HGB A1C): HbA1c, POC (prediabetic range): 11.8 % — AB (ref 5.7–6.4)

## 2021-02-01 MED ORDER — TRUEPLUS LANCETS 28G MISC
11 refills | Status: DC
  Filled 2021-02-01: qty 100, 25d supply, fill #0

## 2021-02-01 MED ORDER — TRUEPLUS PEN NEEDLES 31G X 5 MM MISC
1 refills | Status: DC
  Filled 2021-02-01: qty 100, 25d supply, fill #0

## 2021-02-01 MED ORDER — TRULICITY 0.75 MG/0.5ML ~~LOC~~ SOAJ
0.7500 mg | SUBCUTANEOUS | 1 refills | Status: DC
  Filled 2021-02-01: qty 2, 28d supply, fill #0

## 2021-02-01 MED ORDER — METOPROLOL TARTRATE 25 MG PO TABS
25.0000 mg | ORAL_TABLET | Freq: Two times a day (BID) | ORAL | 1 refills | Status: DC
  Filled 2021-02-01: qty 60, 30d supply, fill #0
  Filled 2021-03-09: qty 60, 30d supply, fill #1
  Filled 2021-06-21: qty 60, 30d supply, fill #2
  Filled 2021-06-21 – 2021-08-09 (×2): qty 60, 30d supply, fill #0
  Filled 2021-09-24: qty 60, 30d supply, fill #1
  Filled 2021-11-18: qty 60, 30d supply, fill #2
  Filled 2022-01-18: qty 60, 30d supply, fill #3

## 2021-02-01 MED ORDER — ATORVASTATIN CALCIUM 80 MG PO TABS
80.0000 mg | ORAL_TABLET | Freq: Every evening | ORAL | 3 refills | Status: DC
Start: 2021-02-01 — End: 2022-08-01
  Filled 2021-02-01: qty 30, 30d supply, fill #0

## 2021-02-01 MED ORDER — TRUE METRIX BLOOD GLUCOSE TEST VI STRP
ORAL_STRIP | 11 refills | Status: DC
  Filled 2021-02-01: qty 100, 25d supply, fill #0

## 2021-02-01 MED ORDER — METFORMIN HCL ER 500 MG PO TB24
1000.0000 mg | ORAL_TABLET | Freq: Two times a day (BID) | ORAL | 1 refills | Status: DC
  Filled 2021-02-01: qty 120, 30d supply, fill #0
  Filled 2021-06-01 – 2021-08-04 (×2): qty 120, 30d supply, fill #1
  Filled 2021-08-04: qty 120, 30d supply, fill #0

## 2021-02-01 MED ORDER — TIZANIDINE HCL 4 MG PO TABS
4.0000 mg | ORAL_TABLET | Freq: Four times a day (QID) | ORAL | 1 refills | Status: AC | PRN
  Filled 2021-02-01: qty 60, 15d supply, fill #0

## 2021-02-01 MED ORDER — DAPAGLIFLOZIN PROPANEDIOL 5 MG PO TABS
5.0000 mg | ORAL_TABLET | Freq: Every day | ORAL | 0 refills | Status: AC
  Filled 2021-02-01: qty 30, 30d supply, fill #0

## 2021-02-01 NOTE — Progress Notes (Signed)
Scott Mcclain  [id number]

## 2021-02-01 NOTE — Progress Notes (Signed)
Assessment & Plan:  Scott Mcclain was seen today for follow-up.  Diagnoses and all orders for this visit:  Controlled type 2 diabetes mellitus with hyperglycemia, with long-term current use of insulin (HCC) -     POCT glucose (manual entry) -     POCT glycosylated hemoglobin (Hb A1C) -     CMP14+EGFR -     Ambulatory referral to Ophthalmology -     Microalbumin / creatinine urine ratio -     metFORMIN (GLUCOPHAGE-XR) 500 MG 24 hr tablet; Take 2 tablets (1,000 mg total) by mouth 2 (two) times daily with a meal. -     TRUEplus Lancets 28G MISC; Use as directed to check blood sugar up to 3 times daily. -     Dulaglutide (TRULICITY) 7.90 WI/0.9BD SOPN; Inject 0.75 mg into the skin once a week. NEEDS PASS -     glucose blood (TRUE METRIX BLOOD GLUCOSE TEST) test strip; Use as directed to check blood sugar up to 3 times daily. -     Insulin Pen Needle (B-D UF III MINI PEN NEEDLES) 31G X 5 MM MISC; Use as instructed. Inject into the skin once weekly -     dapagliflozin propanediol (FARXIGA) 5 MG TABS tablet; Take 1 tablet (5 mg total) by mouth daily before breakfast. Continue blood sugar control as discussed in office today, low carbohydrate diet, and regular physical exercise as tolerated, 150 minutes per week (30 min each day, 5 days per week, or 50 min 3 days per week). Keep blood sugar logs with fasting goal of 90-130 mg/dl, post prandial (after you eat) less than 180.  For Hypoglycemia: BS <60 and Hyperglycemia BS >400; contact the clinic ASAP. Annual eye exams and foot exams are recommended.   Dyslipidemia, goal LDL below 70 -     Lipid panel -     atorvastatin (LIPITOR) 80 MG tablet; Take 1 tablet (80 mg total) by mouth every evening. INSTRUCTIONS: Work on a low fat, heart healthy diet and participate in regular aerobic exercise program by working out at least 150 minutes per week; 5 days a week-30 minutes per day. Avoid red meat/beef/steak,  fried foods. junk foods, sodas, sugary drinks,  unhealthy snacking, alcohol and smoking.  Drink at least 80 oz of water per day and monitor your carbohydrate intake daily.    Screening for deficiency anemia -     CBC  CAD in native artery -     metoprolol tartrate (LOPRESSOR) 25 MG tablet; Take 1 tablet (25 mg total) by mouth 2 (two) times daily.  Pain of right hip joint -     tiZANidine (ZANAFLEX) 4 MG tablet; Take 1 tablet (4 mg total) by mouth every 6 (six) hours as needed for muscle spasms. Well controlled.   Patient has been counseled on age-appropriate routine health concerns for screening and prevention. These are reviewed and up-to-date. Referrals have been placed accordingly. Immunizations are up-to-date or declined.    Subjective:   Chief Complaint  Patient presents with   Follow-up   HPI Scott Mcclain 45 y.o. male presents to office today for follow up to DM, HTN, HPL. He has a past medical history of CAD in native artery, Hyperlipidemia, Hypertension, and Uncontrolled diabetes mellitus ([place]).    I have not seen Mr. Stoney Bang since last year. He states he was not aware he was supposed to be following up every 3 months.    DM 2 Poorly controlled. He is resistant to starting insulin but  agreeable to starting once a week Trulicity. He will continue on metformin 1000 mg BID and I have also started [place] today. Lipids poorly controlled. He has been out of atorvastatin 80 mg daily for quite some time. Denies any statin intolerance or myalgias. Blood pressure is well controlled with lopressor 25 mg BID.  Lab Results  Component Value Date   HGBA1C 11.8 (A) 02/01/2021    Lab Results  Component Value Date   HGBA1C 9.6 (H) 09/18/2019    Lab Results  Component Value Date   LDLCALC 224 (H) 09/18/2019    BP Readings from Last 3 Encounters:  02/01/21 121/69  09/19/19 111/71  08/07/18 111/72     Review of Systems  Constitutional:  Negative for fever, malaise/fatigue and weight loss.  HENT: Negative.  Negative  for nosebleeds.   Eyes: Negative.  Negative for blurred vision, double vision and photophobia.  Respiratory: Negative.  Negative for cough and shortness of breath.   Cardiovascular: Negative.  Negative for chest pain, palpitations and leg swelling.  Gastrointestinal: Negative.  Negative for heartburn, nausea and vomiting.  Musculoskeletal:  Positive for joint pain. Negative for myalgias.  Neurological: Negative.  Negative for dizziness, focal weakness, seizures and headaches.  Psychiatric/Behavioral: Negative.  Negative for suicidal ideas.    Past Medical History:  Diagnosis Date   CAD in native artery    a. Inf STEMI 08/2016: [place] 08/31/16 showed 100% prox RCA, 75% OM1, mild LV dysfunction EF 45-50%, normal LVEDP -> s/p successful stenting of prox-mid RCA with DES, consider PCI of OM1 if recurrent angina. EF 55% by f/u echo same admission.   Hyperlipidemia    Hypertension    Uncontrolled diabetes mellitus ([place])     Past Surgical History:  Procedure Laterality Date   CORONARY STENT INTERVENTION N/A 08/31/2016   Procedure: Coronary Stent Intervention;  Surgeon: Peter M Martinique, MD;  Location: [place] [facility];  Service: Cardiovascular;  Laterality: N/A;   LEFT HEART CATH AND CORONARY ANGIOGRAPHY N/A 08/31/2016   Procedure: Left Heart Cath and Coronary Angiography;  Surgeon: Peter M Martinique, MD;  Location: [place] [facility];  Service: Cardiovascular;  Laterality: N/A;    Family History  Problem Relation Age of Onset   Diabetes Mother    Heart attack Father     Social History Reviewed with no changes to be made today.   Outpatient Medications Prior to Visit  Medication Sig Dispense Refill   aspirin 81 MG EC tablet Take 1 tablet (81 mg total) by mouth daily. Needs appointment to be seen 15 tablet 0   Blood Glucose Monitoring Suppl (TRUE METRIX METER) w/Device KIT Use as directed to check blood sugar up to 3 times daily. 1 kit 0   nitroGLYCERIN (NITROSTAT) 0.4 MG SL tablet Place 1 tablet  (0.4 mg total) under the tongue every 5 (five) minutes as needed for chest pain (up to 3 doses). 25 tablet 3   atorvastatin (LIPITOR) 80 MG tablet Take 1 tablet (80 mg total) by mouth every evening. 90 tablet 3   glucose blood (TRUE METRIX BLOOD GLUCOSE TEST) test strip Use as directed to check blood sugar up to 3 times daily. 100 each 11   metoprolol tartrate (LOPRESSOR) 25 MG tablet TAKE 1 TABLET (25 MG TOTAL) BY MOUTH 2 (TWO) TIMES DAILY. 60 tablet 1   sitaGLIPtin (JANUVIA) 100 MG tablet Take 1 tablet (100 mg total) by mouth daily. 30 tablet 2   tiZANidine (ZANAFLEX) 4 MG tablet Take 1 tablet (4  mg total) by mouth every 6 (six) hours as needed for muscle spasms. 60 tablet 1   TRUEplus Lancets 28G MISC Use as directed to check blood sugar up to 3 times daily. 100 each 11   metFORMIN (GLUCOPHAGE-XR) 500 MG 24 hr tablet TAKE 2 TABLETS (1,000 MG TOTAL) BY MOUTH 2 (TWO) TIMES DAILY WITH A MEAL. 360 tablet 1   No facility-administered medications prior to visit.    No Known Allergies     Objective:    BP 121/69   Pulse 69   Ht 5' 5" (1.651 m)   Wt 164 lb 8 oz (74.6 kg)   SpO2 98%   BMI 27.37 kg/m  Wt Readings from Last 3 Encounters:  02/01/21 164 lb 8 oz (74.6 kg)  09/19/19 161 lb 6.4 oz (73.2 kg)  08/07/18 153 lb (69.4 kg)    Physical Exam Vitals and nursing note reviewed.  Constitutional:      Appearance: He is well-developed.  HENT:     Head: Normocephalic and atraumatic.  Cardiovascular:     Rate and Rhythm: Normal rate and regular rhythm.     Heart sounds: Normal heart sounds. No murmur heard.   No friction rub. No gallop.  Pulmonary:     Effort: Pulmonary effort is normal. No tachypnea or respiratory distress.     Breath sounds: Normal breath sounds. No decreased breath sounds, wheezing, rhonchi or rales.  Chest:     Chest wall: No tenderness.  Abdominal:     General: Bowel sounds are normal.     Palpations: Abdomen is soft.  Musculoskeletal:        General: Normal  range of motion.     Cervical back: Normal range of motion.  Skin:    General: Skin is warm and dry.  Neurological:     Mental Status: He is alert and oriented to person, place, and time.     Coordination: Coordination normal.  Psychiatric:        Behavior: Behavior normal. Behavior is cooperative.        Thought Content: Thought content normal.        Judgment: Judgment normal.         Patient has been counseled extensively about nutrition and exercise as well as the importance of adherence with medications and regular follow-up. The patient was given clear instructions to go to ER or return to medical center if symptoms don't improve, worsen or new problems develop. The patient verbalized understanding.   Follow-up: Return for 4  weeks LUKE meter CHECK. See me in 3 months.   Gildardo Pounds, FNP-BC [facility] [place], [place]   02/01/2021, 9:07 AM

## 2021-02-02 ENCOUNTER — Other Ambulatory Visit: Payer: Self-pay

## 2021-02-02 LAB — MICROALBUMIN / CREATININE URINE RATIO
Creatinine, Urine: 30 mg/dL
Microalb/Creat Ratio: <10 mg/g creat (ref 0–29)
Microalbumin, Urine: <3 ug/mL

## 2021-02-02 LAB — CBC
Hematocrit: 45.1 % (ref 37.5–51.0)
Hemoglobin: 14.9 g/dL (ref 13.0–17.7)
MCH: 26.5 pg — ABNORMAL LOW (ref 26.6–33.0)
MCHC: 33 g/dL (ref 31.5–35.7)
MCV: 80 fL (ref 79–97)
Platelets: 176 10*3/uL (ref 150–450)
RBC: 5.62 x10E6/uL (ref 4.14–5.80)
RDW: 12.4 % (ref 11.6–15.4)
WBC: 5 10*3/uL (ref 3.4–10.8)

## 2021-02-02 LAB — CMP14+EGFR
ALT: 27 IU/L (ref 0–44)
AST: 18 IU/L (ref 0–40)
Albumin/Globulin Ratio: 1.7 (ref 1.2–2.2)
Albumin: 4.4 g/dL (ref 4.0–5.0)
Alkaline Phosphatase: 78 IU/L (ref 44–121)
BUN/Creatinine Ratio: 12 (ref 9–20)
BUN: 12 mg/dL (ref 6–24)
Bilirubin Total: 0.5 mg/dL (ref 0.0–1.2)
CO2: 26 mmol/L (ref 20–29)
Calcium: 9.6 mg/dL (ref 8.7–10.2)
Chloride: 101 mmol/L (ref 96–106)
Creatinine, Ser: 0.98 mg/dL (ref 0.76–1.27)
Globulin, Total: 2.6 g/dL (ref 1.5–4.5)
Glucose: 245 mg/dL — ABNORMAL HIGH (ref 65–99)
Potassium: 4.5 mmol/L (ref 3.5–5.2)
Sodium: 137 mmol/L (ref 134–144)
Total Protein: 7 g/dL (ref 6.0–8.5)
eGFR: 96 mL/min/{1.73_m2} (ref >59)

## 2021-02-02 LAB — LIPID PANEL
Chol/HDL Ratio: 4.9 ratio (ref 0.0–5.0)
Cholesterol, Total: 233 mg/dL — ABNORMAL HIGH (ref 100–199)
HDL: 48 mg/dL (ref >39)
LDL Chol Calc (NIH): 158 mg/dL — ABNORMAL HIGH (ref 0–99)
Triglycerides: 147 mg/dL (ref 0–149)
VLDL Cholesterol Cal: 27 mg/dL (ref 5–40)

## 2021-02-03 ENCOUNTER — Other Ambulatory Visit: Payer: Self-pay

## 2021-03-01 ENCOUNTER — Ambulatory Visit: Payer: Self-pay | Admitting: Pharmacist

## 2021-03-09 ENCOUNTER — Other Ambulatory Visit: Payer: Self-pay

## 2021-03-11 ENCOUNTER — Other Ambulatory Visit: Payer: Self-pay

## 2021-05-02 ENCOUNTER — Ambulatory Visit: Payer: Self-pay | Admitting: Nurse Practitioner

## 2021-05-03 ENCOUNTER — Telehealth: Payer: Self-pay | Admitting: Nurse Practitioner

## 2021-05-03 ENCOUNTER — Ambulatory Visit: Payer: Self-pay | Attending: Nurse Practitioner | Admitting: Nurse Practitioner

## 2021-05-03 ENCOUNTER — Other Ambulatory Visit: Payer: Self-pay

## 2021-05-03 NOTE — Telephone Encounter (Signed)
No answer. LVM ?

## 2021-06-01 ENCOUNTER — Other Ambulatory Visit: Payer: Self-pay

## 2021-06-03 ENCOUNTER — Other Ambulatory Visit: Payer: Self-pay

## 2021-06-21 ENCOUNTER — Other Ambulatory Visit: Payer: Self-pay

## 2021-06-28 ENCOUNTER — Other Ambulatory Visit: Payer: Self-pay

## 2021-08-04 ENCOUNTER — Other Ambulatory Visit: Payer: Self-pay

## 2021-08-09 ENCOUNTER — Other Ambulatory Visit: Payer: Self-pay

## 2021-09-26 ENCOUNTER — Other Ambulatory Visit: Payer: Self-pay

## 2021-09-28 ENCOUNTER — Other Ambulatory Visit: Payer: Self-pay

## 2021-10-18 ENCOUNTER — Other Ambulatory Visit: Payer: Self-pay

## 2021-11-18 ENCOUNTER — Other Ambulatory Visit: Payer: Self-pay

## 2021-11-24 ENCOUNTER — Other Ambulatory Visit: Payer: Self-pay

## 2021-12-14 ENCOUNTER — Encounter (HOSPITAL_COMMUNITY): Payer: Self-pay | Admitting: Emergency Medicine

## 2021-12-14 ENCOUNTER — Ambulatory Visit (HOSPITAL_COMMUNITY)
Admission: EM | Admit: 2021-12-14 | Discharge: 2021-12-14 | Disposition: A | Discharge status: Home / Self Care | Payer: Self-pay | Attending: Emergency Medicine | Admitting: Emergency Medicine

## 2021-12-14 DIAGNOSIS — H7292 Unspecified perforation of tympanic membrane, left ear: Secondary | ICD-10-CM

## 2021-12-14 DIAGNOSIS — E1165 Type 2 diabetes mellitus with hyperglycemia: Secondary | ICD-10-CM

## 2021-12-14 DIAGNOSIS — R42 Dizziness and giddiness: Secondary | ICD-10-CM

## 2021-12-14 LAB — CBC WITH DIFFERENTIAL/PLATELET
Abs Immature Granulocytes: 0.02 10*3/uL (ref 0.00–0.07)
Basophils Absolute: 0.1 10*3/uL (ref 0.0–0.1)
Basophils Relative: 1 %
Eosinophils Absolute: 0 10*3/uL (ref 0.0–0.5)
Eosinophils Relative: 0 %
HCT: 42.6 % (ref 39.0–52.0)
Hemoglobin: 14.1 g/dL (ref 13.0–17.0)
Immature Granulocytes: 0 %
Lymphocytes Relative: 37 %
Lymphs Abs: 2 10*3/uL (ref 0.7–4.0)
MCH: 26.5 pg (ref 26.0–34.0)
MCHC: 33.1 g/dL (ref 30.0–36.0)
MCV: 79.9 fL — ABNORMAL LOW (ref 80.0–100.0)
Monocytes Absolute: 0.4 10*3/uL (ref 0.1–1.0)
Monocytes Relative: 7 %
Neutro Abs: 3 10*3/uL (ref 1.7–7.7)
Neutrophils Relative %: 55 %
Platelets: 196 10*3/uL (ref 150–400)
RBC: 5.33 MIL/uL (ref 4.22–5.81)
RDW: 13 % (ref 11.5–15.5)
WBC: 5.5 10*3/uL (ref 4.0–10.5)
nRBC: 0 % (ref 0.0–0.2)

## 2021-12-14 LAB — BASIC METABOLIC PANEL
Anion gap: 9 (ref 5–15)
BUN: 15 mg/dL (ref 6–20)
CO2: 21 mmol/L — ABNORMAL LOW (ref 22–32)
Calcium: 8.7 mg/dL — ABNORMAL LOW (ref 8.9–10.3)
Chloride: 102 mmol/L (ref 98–111)
Creatinine, Ser: 1.04 mg/dL (ref 0.61–1.24)
GFR, Estimated: >60 mL/min (ref >60)
Glucose, Bld: 438 mg/dL — ABNORMAL HIGH (ref 70–99)
Potassium: 3.6 mmol/L (ref 3.5–5.1)
Sodium: 132 mmol/L — ABNORMAL LOW (ref 135–145)

## 2021-12-14 LAB — CBG MONITORING, ED: Glucose-Capillary: 414 mg/dL — ABNORMAL HIGH (ref 70–99)

## 2021-12-14 MED ORDER — AMOXICILLIN 500 MG PO CAPS: 500.0000 mg | ORAL_CAPSULE | Freq: Two times a day (BID) | ORAL | 0 refills | Status: DC

## 2021-12-14 NOTE — ED Triage Notes (Signed)
Pt is present today with c/o dizziness and left ear pain. Pt states his sx started x2 days ago

## 2021-12-14 NOTE — ED Provider Notes (Signed)
[place]    CSN: [id number] Arrival date & time: 12/14/21  1709      History   Chief Complaint Chief Complaint  Patient presents with   Ear Pain   Dizziness    HPI Scott Mcclain is a 46 y.o. male.  Patient states that he felt poorly 12/11/2018 3 in the evening.  The next day he started having left ear pain that was severe, and later that day the ear "popped" and some fluid drained out and his ear felt improved.  Yesterday he developed lightheadedness/dizziness with position changes.    Patient reports he is still taking metformin and metoprolol but no other medications.  He checks his blood sugar on occasion.  Last checked a few days ago when it was about 190.   Dizziness Associated symptoms: no chest pain     Past Medical History:  Diagnosis Date   CAD in native artery    a. Inf STEMI 08/2016: [place] 08/31/16 showed 100% prox RCA, 75% OM1, mild LV dysfunction EF 45-50%, normal LVEDP -> s/p successful stenting of prox-mid RCA with DES, consider PCI of OM1 if recurrent angina. EF 55% by f/u echo same admission.   Hyperlipidemia    Hypertension    Uncontrolled diabetes mellitus     Patient Active Problem List   Diagnosis Date Noted   Controlled type 2 diabetes mellitus without complication, with long-term current use of insulin ([place]) 06/26/2018   Hyperlipidemia 01/05/2017   CAD in native artery 09/02/2016   Essential hypertension 09/02/2016   Ischemic cardiomyopathy 09/02/2016   STEMI involving right coronary artery ([place]) 08/31/2016    Past Surgical History:  Procedure Laterality Date   CORONARY STENT INTERVENTION N/A 08/31/2016   Procedure: Coronary Stent Intervention;  Surgeon: Peter M Martinique, MD;  Location: [place] [facility];  Service: Cardiovascular;  Laterality: N/A;   LEFT HEART CATH AND CORONARY ANGIOGRAPHY N/A 08/31/2016   Procedure: Left Heart Cath and Coronary Angiography;  Surgeon: Peter M Martinique, MD;  Location: [place] [facility];  Service:  Cardiovascular;  Laterality: N/A;       Home Medications    Prior to Admission medications   Medication Sig Start Date End Date Taking? Authorizing Provider  amoxicillin (AMOXIL) 500 MG capsule Take 1 capsule (500 mg total) by mouth 2 (two) times daily. 12/14/21  Yes Carvel Getting, NP  aspirin 81 MG EC tablet Take 1 tablet (81 mg total) by mouth daily. Needs appointment to be seen 01/29/18   Martinique, Peter M, MD  atorvastatin (LIPITOR) 80 MG tablet Take 1 tablet (80 mg total) by mouth every evening. 02/01/21   Gildardo Pounds, NP  Blood Glucose Monitoring Suppl (TRUE METRIX METER) w/Device KIT Use as directed to check blood sugar up to 3 times daily. 06/28/18   Charlott Rakes, MD  Dulaglutide (TRULICITY) 3.50 KX/3.8HW SOPN Inject 0.75 mg into the skin once a week. NEEDS PASS 02/01/21   Gildardo Pounds, NP  glucose blood (TRUE METRIX BLOOD GLUCOSE TEST) test strip Use as directed to check blood sugar up to 3 times daily. 02/01/21   Gildardo Pounds, NP  Insulin Pen Needle (TRUEPLUS PEN NEEDLES) 31G X 5 MM MISC use as instructed 02/01/21   Gildardo Pounds, NP  metFORMIN (GLUCOPHAGE-XR) 500 MG 24 hr tablet Take 2 tablets (1,000 mg total) by mouth 2 (two) times daily with a meal. 02/01/21 09/08/21  Gildardo Pounds, NP  metoprolol tartrate (LOPRESSOR) 25 MG tablet Take 1  tablet (25 mg total) by mouth 2 (two) times daily. 02/01/21 12/24/21  Gildardo Pounds, NP  nitroGLYCERIN (NITROSTAT) 0.4 MG SL tablet Place 1 tablet (0.4 mg total) under the tongue every 5 (five) minutes as needed for chest pain (up to 3 doses). 09/19/19   Gildardo Pounds, NP  tiZANidine (ZANAFLEX) 4 MG tablet Take 1 tablet (4 mg total) by mouth every 6 (six) hours as needed for muscle spasms. 02/01/21   Gildardo Pounds, NP  TRUEplus Lancets 28G MISC Use as directed to check blood sugar up to 3 times daily. 02/01/21   Gildardo Pounds, NP    Family History Family History  Problem Relation Age of Onset   Diabetes Mother    Heart  attack Father     Social History Social History   Tobacco Use   Smoking status: Never   Smokeless tobacco: Never  Vaping Use   Vaping Use: Never used  Substance Use Topics   Alcohol use: Not Currently    Comment: occ   Drug use: No     Allergies   Patient has no known allergies.   Review of Systems Review of Systems  Constitutional:  Negative for chills and fever.  HENT:  Positive for ear discharge and ear pain. Negative for congestion and sore throat.   Cardiovascular:  Negative for chest pain.  Endocrine: Negative for polyuria.  Neurological:  Positive for dizziness.     Physical Exam Triage Vital Signs ED Triage Vitals [12/14/21 1739]  Enc Vitals Group     BP 124/77     Pulse Rate 73     Resp 18     Temp 97.8 F (36.6 C)     Temp src      SpO2 97 %     Weight      Height      Head Circumference      Peak Flow      Pain Score      Pain Loc      Pain Edu?      Excl. in [place]?    Orthostatic VS for the past 24 hrs:  BP- Lying Pulse- Lying BP- Sitting Pulse- Sitting BP- Standing at 0 minutes Pulse- Standing at 0 minutes  12/14/21 1802 122/76 73 121/83 69 122/71 76    Updated Vital Signs BP 124/77   Pulse 73   Temp 97.8 F (36.6 C)   Resp 18   SpO2 97%   Visual Acuity Right Eye Distance:   Left Eye Distance:   Bilateral Distance:    Right Eye Near:   Left Eye Near:    Bilateral Near:     Physical Exam Constitutional:      General: He is not in acute distress.    Appearance: Normal appearance. He is not ill-appearing.  HENT:     Right Ear: Tympanic membrane, ear canal and external ear normal.     Left Ear: Ear canal and external ear normal. Tympanic membrane is perforated.     Ears:     Comments: Fluid behind the left TM is white Cardiovascular:     Rate and Rhythm: Normal rate and regular rhythm.  Pulmonary:     Effort: Pulmonary effort is normal.     Breath sounds: Normal breath sounds.  Neurological:     Mental Status: He is alert.       UC Treatments / Results  Labs (all labs ordered are listed, but only abnormal results are  displayed) Labs Reviewed  CBC WITH DIFFERENTIAL/PLATELET - Abnormal; Notable for the following components:      Result Value   MCV 79.9 (*)    All other components within normal limits  CBG MONITORING, ED - Abnormal; Notable for the following components:   Glucose-Capillary 414 (*)    All other components within normal limits  BASIC METABOLIC PANEL  HEMOGLOBIN A1C    EKG   Radiology No results found.  Procedures Procedures (including critical care time)  Medications Ordered in UC Medications - No data to display  Initial Impression / Assessment and Plan / UC Course  I have reviewed the triage vital signs and the nursing notes.  Pertinent labs & imaging results that were available during my care of the patient were reviewed by me and considered in my medical decision making (see chart for details).    Patient has not seen PCP in almost a year.  Glucose today is 414.  I obtained a BMP, CBC with differential (given illness symptoms) and an A1c.  Patient is to contact his PCPs office for close follow-up.  He needs help with his diabetes control.  Final Clinical Impressions(s) / UC Diagnoses   Final diagnoses:  Uncontrolled type 2 diabetes mellitus with hyperglycemia ([place])  Dizziness  Perforation of left tympanic membrane     Discharge Instructions      Please call the [place] and [place] for an appointment. Your diabetes is out of control and you need more help with it.      ED Prescriptions     Medication Sig Dispense Auth. Provider   amoxicillin (AMOXIL) 500 MG capsule Take 1 capsule (500 mg total) by mouth 2 (two) times daily. 20 capsule Carvel Getting, NP      PDMP not reviewed this encounter.   Carvel Getting, NP 12/14/21 1843

## 2021-12-14 NOTE — Discharge Instructions (Addendum)
Please call the [facility] for an appointment. Your diabetes is out of control and you need more help with it.

## 2022-01-18 ENCOUNTER — Other Ambulatory Visit: Payer: Self-pay

## 2022-01-20 ENCOUNTER — Other Ambulatory Visit: Payer: Self-pay

## 2022-02-23 ENCOUNTER — Other Ambulatory Visit: Payer: Self-pay | Admitting: Nurse Practitioner

## 2022-02-23 ENCOUNTER — Other Ambulatory Visit: Payer: Self-pay

## 2022-02-23 DIAGNOSIS — I2510 Atherosclerotic heart disease of native coronary artery without angina pectoris: Secondary | ICD-10-CM

## 2022-02-23 DIAGNOSIS — E1165 Type 2 diabetes mellitus with hyperglycemia: Secondary | ICD-10-CM

## 2022-02-23 MED ORDER — METFORMIN HCL ER 500 MG PO TB24
1000.0000 mg | ORAL_TABLET | Freq: Two times a day (BID) | ORAL | 0 refills | Status: DC
  Filled 2022-02-23: qty 120, 30d supply, fill #0

## 2022-02-23 MED ORDER — METOPROLOL TARTRATE 25 MG PO TABS
25.0000 mg | ORAL_TABLET | Freq: Two times a day (BID) | ORAL | 0 refills | Status: DC
  Filled 2022-02-23: qty 60, 30d supply, fill #0

## 2022-02-23 NOTE — Telephone Encounter (Signed)
Attempted to call patient (interpreter: [phone]) to schedule appointment- left message to call office. Courtesy 30 day Rx sent for patient. Requested Prescriptions  Pending Prescriptions Disp Refills  . metFORMIN (GLUCOPHAGE-XR) 500 MG 24 hr tablet 360 tablet 1    Sig: Take 2 tablets (1,000 mg total) by mouth 2 (two) times daily with a meal.     Endocrinology:  Diabetes - Biguanides Failed - 02/23/2022 11:00 AM      Failed - HBA1C is between 0 and 7.9 and within 180 days    HbA1c, POC (prediabetic range)  Date Value Ref Range Status  02/01/2021 11.8 (A) 5.7 - 6.4 % Final         Failed - B12 Level in normal range and within 720 days    No results found for: "VITAMINB12"       Failed - Valid encounter within last 6 months    Recent Outpatient Visits          1 year ago Controlled type 2 diabetes mellitus with hyperglycemia, with long-term current use of insulin ([place])   [place], [place] W, NP   2 years ago Uncontrolled type 2 diabetes mellitus with hyperglycemia [facility])   [place], Jarome Matin, RPH-CPP   2 years ago Uncontrolled type 2 diabetes mellitus with hyperglycemia [facility])   [place], [place] W, NP   3 years ago Community acquired pneumonia of right upper lobe of lung [facility])   [place], [place] W, NP   3 years ago Controlled type 2 diabetes mellitus without complication, with long-term current use of insulin ([place])   [place], [place] W, NP             Passed - Cr in normal range and within 360 days    Creatinine, Ser  Date Value Ref Range Status  12/14/2021 1.04 0.61 - 1.24 mg/dL Final   Creatinine, POC  Date Value Ref Range Status  09/27/2016 50 mg/dL Final         Passed - eGFR in normal range and within 360 days    GFR calc Af Amer  Date Value Ref Range Status   09/18/2019 116 >59 mL/min/1.73 Final   GFR, Estimated  Date Value Ref Range Status  12/14/2021 >60 >60 mL/min Final    Comment:    (NOTE) Calculated using the CKD-EPI Creatinine Equation (2021)    eGFR  Date Value Ref Range Status  02/01/2021 96 >59 mL/min/1.73 Final         Passed - CBC within normal limits and completed in the last 12 months    WBC  Date Value Ref Range Status  12/14/2021 5.5 4.0 - 10.5 K/uL Final   RBC  Date Value Ref Range Status  12/14/2021 5.33 4.22 - 5.81 MIL/uL Final   Hemoglobin  Date Value Ref Range Status  12/14/2021 14.1 13.0 - 17.0 g/dL Final  02/01/2021 14.9 13.0 - 17.7 g/dL Final   HCT  Date Value Ref Range Status  12/14/2021 42.6 39.0 - 52.0 % Final   Hematocrit  Date Value Ref Range Status  02/01/2021 45.1 37.5 - 51.0 % Final   MCHC  Date Value Ref Range Status  12/14/2021 33.1 30.0 - 36.0 g/dL Final   [facility]  Date Value Ref Range Status  12/14/2021 26.5 26.0 - 34.0 pg Final   MCV  Date Value Ref Range Status  12/14/2021 79.9 (L) 80.0 - 100.0 fL Final  02/01/2021 80 79 - 97 fL Final   No results found for: "PLTCOUNTKUC", "LABPLAT", "POCPLA" RDW  Date Value Ref Range Status  12/14/2021 13.0 11.5 - 15.5 % Final  02/01/2021 12.4 11.6 - 15.4 % Final         . metoprolol tartrate (LOPRESSOR) 25 MG tablet 180 tablet 1    Sig: Take 1 tablet (25 mg total) by mouth 2 (two) times daily.     Cardiovascular:  Beta Blockers Failed - 02/23/2022 11:00 AM      Failed - Valid encounter within last 6 months    Recent Outpatient Visits          1 year ago Controlled type 2 diabetes mellitus with hyperglycemia, with long-term current use of insulin [facility])   [place], [place] W, NP   2 years ago Uncontrolled type 2 diabetes mellitus with hyperglycemia [facility])   [place], Jarome Matin, RPH-CPP   2 years ago Uncontrolled type 2 diabetes mellitus with  hyperglycemia [facility])   [place], [place] W, NP   3 years ago Community acquired pneumonia of right upper lobe of lung [facility])   [place], [place] W, NP   3 years ago Controlled type 2 diabetes mellitus without complication, with long-term current use of insulin [facility])   [place], [place] W, NP             Passed - Last BP in normal range    BP Readings from Last 1 Encounters:  12/14/21 124/77         Passed - Last Heart Rate in normal range    Pulse Readings from Last 1 Encounters:  12/14/21 73

## 2022-02-24 ENCOUNTER — Other Ambulatory Visit: Payer: Self-pay

## 2022-04-10 ENCOUNTER — Other Ambulatory Visit: Payer: Self-pay | Admitting: Nurse Practitioner

## 2022-04-10 DIAGNOSIS — I2510 Atherosclerotic heart disease of native coronary artery without angina pectoris: Secondary | ICD-10-CM

## 2022-04-13 ENCOUNTER — Other Ambulatory Visit: Payer: Self-pay | Admitting: Nurse Practitioner

## 2022-04-13 ENCOUNTER — Other Ambulatory Visit: Payer: Self-pay

## 2022-04-13 DIAGNOSIS — I2510 Atherosclerotic heart disease of native coronary artery without angina pectoris: Secondary | ICD-10-CM

## 2022-04-13 DIAGNOSIS — E1165 Type 2 diabetes mellitus with hyperglycemia: Secondary | ICD-10-CM

## 2022-04-13 NOTE — Telephone Encounter (Signed)
Unable to refill per protocol, appointment needed. Duplicate request, will refuse.  Requested Prescriptions  Pending Prescriptions Disp Refills   metoprolol tartrate (LOPRESSOR) 25 MG tablet 60 tablet 0    Sig: Take 1 tablet (25 mg total) by mouth 2 (two) times daily.     Cardiovascular:  Beta Blockers Failed - 04/13/2022  3:48 PM      Failed - Valid encounter within last 6 months    Recent Outpatient Visits           1 year ago Controlled type 2 diabetes mellitus with hyperglycemia, with long-term current use of insulin [facility])   [place] [facility] [place], [place] W, NP   2 years ago Uncontrolled type 2 diabetes mellitus with hyperglycemia [facility])   [facility] [place], Cornelius Moras, RPH-CPP   2 years ago Uncontrolled type 2 diabetes mellitus with hyperglycemia [facility])   [place] [facility] [place], [place] W, NP   3 years ago Community acquired pneumonia of right upper lobe of lung [facility])   [facility] [place], [place] W, NP   3 years ago Controlled type 2 diabetes mellitus without complication, with long-term current use of insulin [facility])   [place] [facility] [place], [place] W, NP              Passed - Last BP in normal range    BP Readings from Last 1 Encounters:  12/14/21 124/77         Passed - Last Heart Rate in normal range    Pulse Readings from Last 1 Encounters:  12/14/21 73

## 2022-04-13 NOTE — Telephone Encounter (Signed)
Requested medication (s) are due for refill today:yes  Requested medication (s) are on the active medication list: yes  Last refill:  02/23/22  Future visit scheduled: no  Notes to clinic:  Unable to refill per protocol, courtesy refill already given, routing for provider approval.      Requested Prescriptions  Pending Prescriptions Disp Refills   metFORMIN (GLUCOPHAGE-XR) 500 MG 24 hr tablet 120 tablet 0    Sig: Take 2 tablets (1,000 mg total) by mouth 2 (two) times daily with a meal.     Endocrinology:  Diabetes - Biguanides Failed - 04/13/2022  9:22 AM      Failed - HBA1C is between 0 and 7.9 and within 180 days    HbA1c, POC (prediabetic range)  Date Value Ref Range Status  02/01/2021 11.8 (A) 5.7 - 6.4 % Final         Failed - B12 Level in normal range and within 720 days    No results found for: "VITAMINB12"       Failed - Valid encounter within last 6 months    Recent Outpatient Visits           1 year ago Controlled type 2 diabetes mellitus with hyperglycemia, with long-term current use of insulin ([place])   [place], [place] W, NP   2 years ago Uncontrolled type 2 diabetes mellitus with hyperglycemia [facility])   [place], Jarome Matin, RPH-CPP   2 years ago Uncontrolled type 2 diabetes mellitus with hyperglycemia [facility])   [place], [place] W, NP   3 years ago Community acquired pneumonia of right upper lobe of lung [facility])   [place], [place] W, NP   3 years ago Controlled type 2 diabetes mellitus without complication, with long-term current use of insulin ([place])   [place], [place] W, NP              Passed - Cr in normal range and within 360 days    Creatinine, Ser  Date Value Ref Range Status  12/14/2021 1.04 0.61 - 1.24 mg/dL Final   Creatinine, POC  Date Value Ref  Range Status  09/27/2016 50 mg/dL Final         Passed - eGFR in normal range and within 360 days    GFR calc Af Amer  Date Value Ref Range Status  09/18/2019 116 >59 mL/min/1.73 Final   GFR, Estimated  Date Value Ref Range Status  12/14/2021 >60 >60 mL/min Final    Comment:    (NOTE) Calculated using the CKD-EPI Creatinine Equation (2021)    eGFR  Date Value Ref Range Status  02/01/2021 96 >59 mL/min/1.73 Final         Passed - CBC within normal limits and completed in the last 12 months    WBC  Date Value Ref Range Status  12/14/2021 5.5 4.0 - 10.5 K/uL Final   RBC  Date Value Ref Range Status  12/14/2021 5.33 4.22 - 5.81 MIL/uL Final   Hemoglobin  Date Value Ref Range Status  12/14/2021 14.1 13.0 - 17.0 g/dL Final  02/01/2021 14.9 13.0 - 17.7 g/dL Final   HCT  Date Value Ref Range Status  12/14/2021 42.6 39.0 - 52.0 % Final   Hematocrit  Date Value Ref Range Status  02/01/2021 45.1 37.5 - 51.0 % Final   MCHC  Date Value Ref Range Status  12/14/2021 33.1 30.0 - 36.0 g/dL Final   [facility]  Date Value Ref Range Status  12/14/2021 26.5 26.0 - 34.0 pg Final   MCV  Date Value Ref Range Status  12/14/2021 79.9 (L) 80.0 - 100.0 fL Final  02/01/2021 80 79 - 97 fL Final   No results found for: "PLTCOUNTKUC", "LABPLAT", "POCPLA" RDW  Date Value Ref Range Status  12/14/2021 13.0 11.5 - 15.5 % Final  02/01/2021 12.4 11.6 - 15.4 % Final          metoprolol tartrate (LOPRESSOR) 25 MG tablet 60 tablet 0    Sig: Take 1 tablet (25 mg total) by mouth 2 (two) times daily.     Cardiovascular:  Beta Blockers Failed - 04/13/2022  9:22 AM      Failed - Valid encounter within last 6 months    Recent Outpatient Visits           1 year ago Controlled type 2 diabetes mellitus with hyperglycemia, with long-term current use of insulin [facility])   [place], [place] W, NP   2 years ago Uncontrolled type 2 diabetes mellitus with  hyperglycemia [facility])   [place], Jarome Matin, RPH-CPP   2 years ago Uncontrolled type 2 diabetes mellitus with hyperglycemia [facility])   [place], [place] W, NP   3 years ago Community acquired pneumonia of right upper lobe of lung [facility])   [place], [place] W, NP   3 years ago Controlled type 2 diabetes mellitus without complication, with long-term current use of insulin [facility])   [place], [place] W, NP              Passed - Last BP in normal range    BP Readings from Last 1 Encounters:  12/14/21 124/77         Passed - Last Heart Rate in normal range    Pulse Readings from Last 1 Encounters:  12/14/21 73

## 2022-04-17 ENCOUNTER — Other Ambulatory Visit: Payer: Self-pay

## 2022-05-12 ENCOUNTER — Other Ambulatory Visit: Payer: Self-pay | Admitting: Nurse Practitioner

## 2022-05-12 DIAGNOSIS — I2510 Atherosclerotic heart disease of native coronary artery without angina pectoris: Secondary | ICD-10-CM

## 2022-05-22 ENCOUNTER — Other Ambulatory Visit: Payer: Self-pay

## 2022-06-10 ENCOUNTER — Other Ambulatory Visit: Payer: Self-pay | Admitting: Nurse Practitioner

## 2022-06-10 DIAGNOSIS — I2510 Atherosclerotic heart disease of native coronary artery without angina pectoris: Secondary | ICD-10-CM

## 2022-06-12 ENCOUNTER — Other Ambulatory Visit: Payer: Self-pay

## 2022-06-12 NOTE — Telephone Encounter (Signed)
Requested medications are due for refill today.  yes  Requested medications are on the active medications list.  yes  Last refill. 02/23/2022 #60 0 rf  Future visit scheduled.   no  Notes to clinic.  Pt already given a courtesy refill. Rx is expired (05/24/2022). PT is more than 3 months overdue for an OV.    Requested Prescriptions  Pending Prescriptions Disp Refills   metoprolol tartrate (LOPRESSOR) 25 MG tablet 60 tablet 0    Sig: Take 1 tablet (25 mg total) by mouth 2 (two) times daily.     Cardiovascular:  Beta Blockers Failed - 06/10/2022  1:55 PM      Failed - Valid encounter within last 6 months    Recent Outpatient Visits           1 year ago Controlled type 2 diabetes mellitus with hyperglycemia, with long-term current use of insulin [facility])   [place], [place] W, NP   2 years ago Uncontrolled type 2 diabetes mellitus with hyperglycemia [facility])   [place], Jarome Matin, RPH-CPP   2 years ago Uncontrolled type 2 diabetes mellitus with hyperglycemia [facility])   [place], [place] W, NP   3 years ago Community acquired pneumonia of right upper lobe of lung [facility])   [place], [place] W, NP   3 years ago Controlled type 2 diabetes mellitus without complication, with long-term current use of insulin [facility])   [place], [place] W, NP              Passed - Last BP in normal range    BP Readings from Last 1 Encounters:  12/14/21 124/77         Passed - Last Heart Rate in normal range    Pulse Readings from Last 1 Encounters:  12/14/21 73

## 2022-07-07 ENCOUNTER — Other Ambulatory Visit: Payer: Self-pay | Admitting: Nurse Practitioner

## 2022-07-07 DIAGNOSIS — E1165 Type 2 diabetes mellitus with hyperglycemia: Secondary | ICD-10-CM

## 2022-07-07 NOTE — Telephone Encounter (Signed)
Requested medications are due for refill today.  yes  Requested medications are on the active medications list.  yes  Last refill. 02/23/2022 #120 0 rf  Future visit scheduled.   yes  Notes to clinic.  Rx written to expire 05/24/2022 - rx is expired.    Requested Prescriptions  Pending Prescriptions Disp Refills   metFORMIN (GLUCOPHAGE-XR) 500 MG 24 hr tablet 120 tablet 0    Sig: Take 2 tablets (1,000 mg total) by mouth 2 (two) times daily with a meal.     Endocrinology:  Diabetes - Biguanides Failed - 07/07/2022  9:52 AM      Failed - HBA1C is between 0 and 7.9 and within 180 days    HbA1c, POC (prediabetic range)  Date Value Ref Range Status  02/01/2021 11.8 (A) 5.7 - 6.4 % Final         Failed - B12 Level in normal range and within 720 days    No results found for: "VITAMINB12"       Failed - Valid encounter within last 6 months    Recent Outpatient Visits           1 year ago Controlled type 2 diabetes mellitus with hyperglycemia, with long-term current use of insulin [facility])   [place], [place] W, NP   2 years ago Uncontrolled type 2 diabetes mellitus with hyperglycemia [facility])   [place], [place] L, RPH-CPP   2 years ago Uncontrolled type 2 diabetes mellitus with hyperglycemia [facility])   [place], [place] W, NP   3 years ago Community acquired pneumonia of right upper lobe of lung [facility])   [place], [place] W, NP   4 years ago Controlled type 2 diabetes mellitus without complication, with long-term current use of insulin ([place])   [place], Vernia Buff, NP       Future Appointments             In 3 weeks Gildardo Pounds, NP [place] in normal range and within 360 days    Creatinine, Ser   Date Value Ref Range Status  12/14/2021 1.04 0.61 - 1.24 mg/dL Final   Creatinine, POC  Date Value Ref Range Status  09/27/2016 50 mg/dL Final         Passed - eGFR in normal range and within 360 days    GFR calc Af Amer  Date Value Ref Range Status  09/18/2019 116 >59 mL/min/1.73 Final   GFR, Estimated  Date Value Ref Range Status  12/14/2021 >60 >60 mL/min Final    Comment:    (NOTE) Calculated using the CKD-EPI Creatinine Equation (2021)    eGFR  Date Value Ref Range Status  02/01/2021 96 >59 mL/min/1.73 Final         Passed - CBC within normal limits and completed in the last 12 months    WBC  Date Value Ref Range Status  12/14/2021 5.5 4.0 - 10.5 K/uL Final   RBC  Date Value Ref Range Status  12/14/2021 5.33 4.22 - 5.81 MIL/uL Final   Hemoglobin  Date Value Ref Range Status  12/14/2021 14.1 13.0 - 17.0 g/dL Final  02/01/2021 14.9 13.0 - 17.7 g/dL Final  HCT  Date Value Ref Range Status  12/14/2021 42.6 39.0 - 52.0 % Final   Hematocrit  Date Value Ref Range Status  02/01/2021 45.1 37.5 - 51.0 % Final   MCHC  Date Value Ref Range Status  12/14/2021 33.1 30.0 - 36.0 g/dL Final   [facility]  Date Value Ref Range Status  12/14/2021 26.5 26.0 - 34.0 pg Final   MCV  Date Value Ref Range Status  12/14/2021 79.9 (L) 80.0 - 100.0 fL Final  02/01/2021 80 79 - 97 fL Final   No results found for: "PLTCOUNTKUC", "LABPLAT", "POCPLA" RDW  Date Value Ref Range Status  12/14/2021 13.0 11.5 - 15.5 % Final  02/01/2021 12.4 11.6 - 15.4 % Final

## 2022-07-10 ENCOUNTER — Other Ambulatory Visit: Payer: Self-pay

## 2022-07-13 ENCOUNTER — Other Ambulatory Visit: Payer: Self-pay

## 2022-08-01 ENCOUNTER — Ambulatory Visit: Payer: Self-pay | Attending: Nurse Practitioner | Admitting: Nurse Practitioner

## 2022-08-01 ENCOUNTER — Encounter: Payer: Self-pay | Admitting: Nurse Practitioner

## 2022-08-01 ENCOUNTER — Other Ambulatory Visit: Payer: Self-pay

## 2022-08-01 VITALS — BP 115/72 | HR 82 | Ht 65.0 in | Wt 170.0 lb

## 2022-08-01 DIAGNOSIS — I10 Essential (primary) hypertension: Secondary | ICD-10-CM

## 2022-08-01 DIAGNOSIS — Z130 Encounter for screening for diseases of the blood and blood-forming organs and certain disorders involving the immune mechanism: Secondary | ICD-10-CM

## 2022-08-01 DIAGNOSIS — E785 Hyperlipidemia, unspecified: Secondary | ICD-10-CM

## 2022-08-01 DIAGNOSIS — E1165 Type 2 diabetes mellitus with hyperglycemia: Secondary | ICD-10-CM

## 2022-08-01 DIAGNOSIS — I2510 Atherosclerotic heart disease of native coronary artery without angina pectoris: Secondary | ICD-10-CM

## 2022-08-01 MED ORDER — METOPROLOL TARTRATE 25 MG PO TABS
25.0000 mg | ORAL_TABLET | Freq: Two times a day (BID) | ORAL | 1 refills | Status: AC
  Filled 2022-08-01: qty 180, 90d supply, fill #0

## 2022-08-01 MED ORDER — TRUE METRIX METER W/DEVICE KIT
PACK | 0 refills | Status: AC
  Filled 2022-08-01: qty 1, 30d supply, fill #0

## 2022-08-01 MED ORDER — TRUEPLUS LANCETS 28G MISC
11 refills | Status: AC
  Filled 2022-08-01: qty 100, 34d supply, fill #0

## 2022-08-01 MED ORDER — TRUE METRIX BLOOD GLUCOSE TEST VI STRP
ORAL_STRIP | 11 refills | Status: AC
  Filled 2022-08-01: qty 100, 34d supply, fill #0

## 2022-08-01 MED ORDER — METFORMIN HCL ER 500 MG PO TB24
1000.0000 mg | ORAL_TABLET | Freq: Two times a day (BID) | ORAL | 1 refills | Status: AC
  Filled 2022-08-01: qty 360, 90d supply, fill #0

## 2022-08-01 MED ORDER — ASPIRIN 81 MG PO TBEC
81.0000 mg | DELAYED_RELEASE_TABLET | Freq: Every day | ORAL | 3 refills | Status: AC
  Filled 2022-08-01: qty 90, 90d supply, fill #0

## 2022-08-01 MED ORDER — TRUEPLUS PEN NEEDLES 31G X 5 MM MISC
1 refills | Status: AC
  Filled 2022-08-01: qty 100, 34d supply, fill #0

## 2022-08-01 MED ORDER — TRULICITY 0.75 MG/0.5ML ~~LOC~~ SOAJ
0.7500 mg | SUBCUTANEOUS | 1 refills | Status: AC
  Filled 2022-08-01: qty 2, 28d supply, fill #0

## 2022-08-01 MED ORDER — ATORVASTATIN CALCIUM 80 MG PO TABS
80.0000 mg | ORAL_TABLET | Freq: Every evening | ORAL | 3 refills | Status: AC
  Filled 2022-08-01: qty 90, 90d supply, fill #0

## 2022-08-01 NOTE — Progress Notes (Signed)
Assessment & Plan:  Scott Mcclain was seen today for medication refill, hypertension and diabetes.  Diagnoses and all orders for this visit:  Essential hypertension -     CMP14+EGFR  Dyslipidemia, goal LDL below 70 -     atorvastatin (LIPITOR) 80 MG tablet; Take 1 tablet (80 mg total) by mouth every evening. -     Lipid panel  Type 2 diabetes mellitus with hyperglycemia, without long-term current use of insulin (HCC) -     Dulaglutide (TRULICITY) A999333 0000000 SOPN; Inject 0.75 mg into the skin once a week. NEEDS PASS -     glucose blood (TRUE METRIX BLOOD GLUCOSE TEST) test strip; Use as directed to check blood sugar up to 3 times daily. -     Insulin Pen Needle (TRUEPLUS PEN NEEDLES) 31G X 5 MM MISC; use as instructed -     metFORMIN (GLUCOPHAGE-XR) 500 MG 24 hr tablet; Take 2 tablets (1,000 mg total) by mouth 2 (two) times daily with a meal. -     TRUEplus Lancets 28G MISC; Use as directed to check blood sugar up to 3 times daily. -     CMP14+EGFR -     Hemoglobin A1c -     Microalbumin / creatinine urine ratio -     Blood Glucose Monitoring Suppl (TRUE METRIX METER) w/Device KIT; Use as directed to check blood sugar up to 3 times daily.  CAD in native artery -     metoprolol tartrate (LOPRESSOR) 25 MG tablet; Take 1 tablet (25 mg total) by mouth 2 (two) times daily. -     aspirin EC 81 MG tablet; Take 1 tablet (81 mg total) by mouth daily. Needs appointment to be seen  Screening for deficiency anemia -     CBC with Differential    Patient has been counseled on age-appropriate routine health concerns for screening and prevention. These are reviewed and up-to-date. Referrals have been placed accordingly. Immunizations are up-to-date or declined.    Subjective:   Chief Complaint  Patient presents with   Medication Refill   Hypertension   Diabetes   HPI Scott Mcclain 47 y.o. male presents to office today for follow up to DM and HTN. He has not been seen in this office in  quite some time (2 years) Has been out of his medications for several months. He never started the Trulicity that was sent to the pharmacy. We did teach back training today with administration of this.   VRI was used to communicate directly with patient for the entire encounter including providing detailed patient instructions.    He has a PMH of CAD, STEMI, PCI with stent, HPL, HTN, DM 2  Diabetes is poorly controlled. Will refill metformin and Trulicity today. LDL not at goal. He has not been taking high intensity statin as prescribed.  Lab Results  Component Value Date   HGBA1C 11.8 (A) 02/01/2021    Lab Results  Component Value Date   LDLCALC 158 (H) 02/01/2021    Blood pressure is well controlled. Will refill metoprolol 25 mg BID.  BP Readings from Last 3 Encounters:  08/01/22 115/72  12/14/21 124/77  02/01/21 121/69     Review of Systems  Constitutional:  Negative for fever, malaise/fatigue and weight loss.  HENT: Negative.  Negative for nosebleeds.   Eyes: Negative.  Negative for blurred vision, double vision and photophobia.  Respiratory: Negative.  Negative for cough and shortness of breath.   Cardiovascular: Negative.  Negative for chest  pain, palpitations and leg swelling.  Gastrointestinal: Negative.  Negative for heartburn, nausea and vomiting.  Musculoskeletal: Negative.  Negative for myalgias.  Neurological: Negative.  Negative for dizziness, focal weakness, seizures and headaches.  Psychiatric/Behavioral: Negative.  Negative for suicidal ideas.     Past Medical History:  Diagnosis Date   CAD in native artery    a. Inf STEMI 08/2016: [place] 08/31/16 showed 100% prox RCA, 75% OM1, mild LV dysfunction EF 45-50%, normal LVEDP -> s/p successful stenting of prox-mid RCA with DES, consider PCI of OM1 if recurrent angina. EF 55% by f/u echo same admission.   Hyperlipidemia    Hypertension    Uncontrolled diabetes mellitus     Past Surgical History:  Procedure  Laterality Date   CORONARY STENT INTERVENTION N/A 08/31/2016   Procedure: Coronary Stent Intervention;  Surgeon: Peter M Martinique, MD;  Location: [place] [facility];  Service: Cardiovascular;  Laterality: N/A;   LEFT HEART CATH AND CORONARY ANGIOGRAPHY N/A 08/31/2016   Procedure: Left Heart Cath and Coronary Angiography;  Surgeon: Peter M Martinique, MD;  Location: [place] [facility];  Service: Cardiovascular;  Laterality: N/A;    Family History  Problem Relation Age of Onset   Diabetes Mother    Heart attack Father     Social History Reviewed with no changes to be made today.   Outpatient Medications Prior to Visit  Medication Sig Dispense Refill   nitroGLYCERIN (NITROSTAT) 0.4 MG SL tablet Place 1 tablet (0.4 mg total) under the tongue every 5 (five) minutes as needed for chest pain (up to 3 doses). 25 tablet 3   tiZANidine (ZANAFLEX) 4 MG tablet Take 1 tablet (4 mg total) by mouth every 6 (six) hours as needed for muscle spasms. 60 tablet 1   amoxicillin (AMOXIL) 500 MG capsule Take 1 capsule (500 mg total) by mouth 2 (two) times daily. 20 capsule 0   aspirin 81 MG EC tablet Take 1 tablet (81 mg total) by mouth daily. Needs appointment to be seen 15 tablet 0   atorvastatin (LIPITOR) 80 MG tablet Take 1 tablet (80 mg total) by mouth every evening. 90 tablet 3   Blood Glucose Monitoring Suppl (TRUE METRIX METER) w/Device KIT Use as directed to check blood sugar up to 3 times daily. 1 kit 0   Dulaglutide (TRULICITY) A999333 0000000 SOPN Inject 0.75 mg into the skin once a week. NEEDS PASS 3 mL 1   glucose blood (TRUE METRIX BLOOD GLUCOSE TEST) test strip Use as directed to check blood sugar up to 3 times daily. 100 each 11   Insulin Pen Needle (TRUEPLUS PEN NEEDLES) 31G X 5 MM MISC use as instructed 100 each 1   metFORMIN (GLUCOPHAGE-XR) 500 MG 24 hr tablet Take 2 tablets (1,000 mg total) by mouth 2 (two) times daily with a meal. 120 tablet 0   metoprolol tartrate (LOPRESSOR) 25 MG tablet Take 1  tablet (25 mg total) by mouth 2 (two) times daily. 60 tablet 0   TRUEplus Lancets 28G MISC Use as directed to check blood sugar up to 3 times daily. 100 each 11   No facility-administered medications prior to visit.    No Known Allergies     Objective:    BP 115/72   Pulse 82   Ht '5\' 5"'$  (1.651 m)   Wt 170 lb (77.1 kg)   SpO2 98%   BMI 28.29 kg/m  Wt Readings from Last 3 Encounters:  08/01/22 170 lb (77.1 kg)  02/01/21 164  lb 8 oz (74.6 kg)  09/19/19 161 lb 6.4 oz (73.2 kg)    Physical Exam Vitals and nursing note reviewed.  Constitutional:      Appearance: He is well-developed.  HENT:     Head: Normocephalic and atraumatic.  Cardiovascular:     Rate and Rhythm: Normal rate and regular rhythm.     Heart sounds: Normal heart sounds. No murmur heard.    No friction rub. No gallop.  Pulmonary:     Effort: Pulmonary effort is normal. No tachypnea or respiratory distress.     Breath sounds: Normal breath sounds. No decreased breath sounds, wheezing, rhonchi or rales.  Chest:     Chest wall: No tenderness.  Abdominal:     General: Bowel sounds are normal.     Palpations: Abdomen is soft.  Musculoskeletal:        General: Normal range of motion.     Cervical back: Normal range of motion.  Skin:    General: Skin is warm and dry.  Neurological:     Mental Status: He is alert and oriented to person, place, and time.     Coordination: Coordination normal.  Psychiatric:        Behavior: Behavior normal. Behavior is cooperative.        Thought Content: Thought content normal.        Judgment: Judgment normal.          Patient has been counseled extensively about nutrition and exercise as well as the importance of adherence with medications and regular follow-up. The patient was given clear instructions to go to ER or return to medical center if symptoms don't improve, worsen or new problems develop. The patient verbalized understanding.   Follow-up: Return in about 6  weeks (around 09/12/2022) for 6 weeks luke meter check.   Gildardo Pounds, FNP-BC [facility], [place]   08/01/2022, 4:21 PM

## 2022-08-02 LAB — CBC WITH DIFFERENTIAL/PLATELET
Basophils Absolute: 0 10*3/uL (ref 0.0–0.2)
Basos: 1 %
EOS (ABSOLUTE): 0 10*3/uL (ref 0.0–0.4)
Eos: 1 %
Hematocrit: 44.2 % (ref 37.5–51.0)
Hemoglobin: 14.8 g/dL (ref 13.0–17.7)
Immature Grans (Abs): 0 10*3/uL (ref 0.0–0.1)
Immature Granulocytes: 0 %
Lymphocytes Absolute: 2.1 10*3/uL (ref 0.7–3.1)
Lymphs: 42 %
MCH: 26.7 pg (ref 26.6–33.0)
MCHC: 33.5 g/dL (ref 31.5–35.7)
MCV: 80 fL (ref 79–97)
Monocytes Absolute: 0.4 10*3/uL (ref 0.1–0.9)
Monocytes: 7 %
Neutrophils Absolute: 2.4 10*3/uL (ref 1.4–7.0)
Neutrophils: 49 %
Platelets: 189 10*3/uL (ref 150–450)
RBC: 5.55 x10E6/uL (ref 4.14–5.80)
RDW: 11.9 % (ref 11.6–15.4)
WBC: 5 10*3/uL (ref 3.4–10.8)

## 2022-08-02 LAB — CMP14+EGFR
ALT: 29 IU/L (ref 0–44)
AST: 17 IU/L (ref 0–40)
Albumin/Globulin Ratio: 2.3 — ABNORMAL HIGH (ref 1.2–2.2)
Albumin: 4.9 g/dL (ref 4.1–5.1)
Alkaline Phosphatase: 93 IU/L (ref 44–121)
BUN/Creatinine Ratio: 13 (ref 9–20)
BUN: 12 mg/dL (ref 6–24)
Bilirubin Total: 0.2 mg/dL (ref 0.0–1.2)
CO2: 25 mmol/L (ref 20–29)
Calcium: 9.6 mg/dL (ref 8.7–10.2)
Chloride: 98 mmol/L (ref 96–106)
Creatinine, Ser: 0.95 mg/dL (ref 0.76–1.27)
Globulin, Total: 2.1 g/dL (ref 1.5–4.5)
Glucose: 352 mg/dL — ABNORMAL HIGH (ref 70–99)
Potassium: 4.4 mmol/L (ref 3.5–5.2)
Sodium: 137 mmol/L (ref 134–144)
Total Protein: 7 g/dL (ref 6.0–8.5)
eGFR: 99 mL/min/{1.73_m2} (ref >59)

## 2022-08-02 LAB — LIPID PANEL
Chol/HDL Ratio: 6.5 ratio — ABNORMAL HIGH (ref 0.0–5.0)
Cholesterol, Total: 331 mg/dL — ABNORMAL HIGH (ref 100–199)
HDL: 51 mg/dL (ref >39)
LDL Chol Calc (NIH): 235 mg/dL — ABNORMAL HIGH (ref 0–99)
Triglycerides: 222 mg/dL — ABNORMAL HIGH (ref 0–149)
VLDL Cholesterol Cal: 45 mg/dL — ABNORMAL HIGH (ref 5–40)

## 2022-08-02 LAB — HEMOGLOBIN A1C
Est. average glucose Bld gHb Est-mCnc: 295 mg/dL
Hgb A1c MFr Bld: 11.9 % — ABNORMAL HIGH (ref 4.8–5.6)

## 2022-08-02 LAB — MICROALBUMIN / CREATININE URINE RATIO
Creatinine, Urine: 30.5 mg/dL
Microalb/Creat Ratio: <10 mg/g creat (ref 0–29)
Microalbumin, Urine: <3 ug/mL

## 2022-08-07 ENCOUNTER — Other Ambulatory Visit: Payer: Self-pay | Admitting: Nurse Practitioner

## 2022-08-07 ENCOUNTER — Other Ambulatory Visit: Payer: Self-pay

## 2022-08-07 DIAGNOSIS — E1165 Type 2 diabetes mellitus with hyperglycemia: Secondary | ICD-10-CM

## 2022-08-07 MED ORDER — EMPAGLIFLOZIN 10 MG PO TABS
10.0000 mg | ORAL_TABLET | Freq: Every day | ORAL | 1 refills | Status: AC
  Filled 2022-08-07: qty 30, 30d supply, fill #0

## 2022-08-14 ENCOUNTER — Other Ambulatory Visit: Payer: Self-pay

## 2022-09-12 ENCOUNTER — Ambulatory Visit: Payer: Self-pay | Admitting: Pharmacist

## 2022-11-22 ENCOUNTER — Other Ambulatory Visit: Payer: Self-pay | Admitting: Orthopedic Surgery

## 2022-11-22 DIAGNOSIS — M545 Low back pain, unspecified: Secondary | ICD-10-CM

## 2022-11-28 ENCOUNTER — Ambulatory Visit
Admission: RE | Admit: 2022-11-28 | Discharge: 2022-11-28 | Disposition: A | Discharge status: Home / Self Care | Payer: No Typology Code available for payment source | Source: Ambulatory Visit | Attending: Orthopedic Surgery | Admitting: Orthopedic Surgery

## 2022-11-28 DIAGNOSIS — M545 Low back pain, unspecified: Secondary | ICD-10-CM

## 2023-08-06 ENCOUNTER — Other Ambulatory Visit: Payer: Self-pay | Admitting: Nurse Practitioner

## 2023-08-06 DIAGNOSIS — E785 Hyperlipidemia, unspecified: Secondary | ICD-10-CM

## 2023-08-06 DIAGNOSIS — E1165 Type 2 diabetes mellitus with hyperglycemia: Secondary | ICD-10-CM

## 2023-08-07 ENCOUNTER — Other Ambulatory Visit: Payer: Self-pay
